# Patient Record
Sex: Female | Born: 1950 | Race: Black or African American | Hispanic: No | Marital: Married | Smoking: Never smoker
Health system: Southern US, Community
[De-identification: ages and names within clinical notes are randomized; demographics above are authoritative.]

## PROBLEM LIST (undated history)

## (undated) DIAGNOSIS — J8410 Pulmonary fibrosis, unspecified: Secondary | ICD-10-CM

## (undated) HISTORY — PX: OTHER SURGICAL HISTORY: SHX169

---

## 1985-02-17 HISTORY — PX: ABDOMINAL HYSTERECTOMY: SHX81

## 1999-06-18 ENCOUNTER — Inpatient Hospital Stay (HOSPITAL_COMMUNITY): Admission: AD | Admit: 1999-06-18 | Discharge: 1999-06-21 | Payer: Self-pay | Admitting: Neurological Surgery

## 1999-06-19 ENCOUNTER — Encounter: Payer: Self-pay | Admitting: Neurological Surgery

## 1999-12-10 ENCOUNTER — Encounter: Payer: Self-pay | Admitting: Neurological Surgery

## 1999-12-10 ENCOUNTER — Ambulatory Visit (HOSPITAL_COMMUNITY): Admission: RE | Admit: 1999-12-10 | Discharge: 1999-12-10 | Payer: Self-pay | Admitting: Neurological Surgery

## 2000-12-16 ENCOUNTER — Encounter: Payer: Self-pay | Admitting: Neurological Surgery

## 2000-12-16 ENCOUNTER — Ambulatory Visit (HOSPITAL_COMMUNITY): Admission: RE | Admit: 2000-12-16 | Discharge: 2000-12-16 | Payer: Self-pay | Admitting: Neurological Surgery

## 2001-02-17 HISTORY — PX: COLONOSCOPY: SHX174

## 2002-08-08 ENCOUNTER — Ambulatory Visit (HOSPITAL_COMMUNITY): Admission: RE | Admit: 2002-08-08 | Discharge: 2002-08-08 | Payer: Self-pay | Admitting: Neurological Surgery

## 2002-08-08 ENCOUNTER — Encounter: Payer: Self-pay | Admitting: Neurological Surgery

## 2004-08-30 ENCOUNTER — Ambulatory Visit (HOSPITAL_COMMUNITY): Admission: RE | Admit: 2004-08-30 | Discharge: 2004-08-30 | Payer: Self-pay | Admitting: Neurological Surgery

## 2004-09-23 ENCOUNTER — Ambulatory Visit: Payer: Self-pay | Admitting: Family Medicine

## 2004-10-04 ENCOUNTER — Ambulatory Visit: Payer: Self-pay | Admitting: Cardiology

## 2005-08-14 ENCOUNTER — Ambulatory Visit: Payer: Self-pay | Admitting: Unknown Physician Specialty

## 2005-10-07 ENCOUNTER — Ambulatory Visit (HOSPITAL_COMMUNITY): Admission: RE | Admit: 2005-10-07 | Discharge: 2005-10-07 | Payer: Self-pay | Admitting: Neurological Surgery

## 2006-01-13 ENCOUNTER — Ambulatory Visit (HOSPITAL_COMMUNITY): Admission: RE | Admit: 2006-01-13 | Discharge: 2006-01-13 | Payer: Self-pay | Admitting: Neurological Surgery

## 2006-08-18 ENCOUNTER — Ambulatory Visit: Payer: Self-pay | Admitting: Unknown Physician Specialty

## 2006-08-25 ENCOUNTER — Ambulatory Visit: Payer: Self-pay | Admitting: Unknown Physician Specialty

## 2006-09-26 ENCOUNTER — Encounter: Admission: RE | Admit: 2006-09-26 | Discharge: 2006-09-26 | Payer: Self-pay | Admitting: Neurological Surgery

## 2007-05-27 ENCOUNTER — Ambulatory Visit: Payer: Self-pay | Admitting: Unknown Physician Specialty

## 2007-09-01 ENCOUNTER — Ambulatory Visit: Payer: Self-pay | Admitting: Unknown Physician Specialty

## 2007-11-03 ENCOUNTER — Encounter: Admission: RE | Admit: 2007-11-03 | Discharge: 2007-11-03 | Payer: Self-pay | Admitting: Neurological Surgery

## 2008-04-19 ENCOUNTER — Encounter: Admission: RE | Admit: 2008-04-19 | Discharge: 2008-04-19 | Payer: Self-pay | Admitting: Neurological Surgery

## 2008-11-08 ENCOUNTER — Ambulatory Visit: Payer: Self-pay | Admitting: Unknown Physician Specialty

## 2009-05-02 ENCOUNTER — Encounter: Admission: RE | Admit: 2009-05-02 | Discharge: 2009-05-02 | Payer: Self-pay | Admitting: Neurological Surgery

## 2009-08-07 ENCOUNTER — Encounter (INDEPENDENT_AMBULATORY_CARE_PROVIDER_SITE_OTHER): Payer: Self-pay | Admitting: Neurological Surgery

## 2009-08-07 ENCOUNTER — Inpatient Hospital Stay (HOSPITAL_COMMUNITY): Admission: RE | Admit: 2009-08-07 | Discharge: 2009-08-10 | Payer: Self-pay | Admitting: Neurological Surgery

## 2009-10-11 ENCOUNTER — Encounter: Admission: RE | Admit: 2009-10-11 | Discharge: 2009-10-11 | Payer: Self-pay | Admitting: Neurological Surgery

## 2009-11-14 ENCOUNTER — Ambulatory Visit: Payer: Self-pay | Admitting: Unknown Physician Specialty

## 2010-05-05 LAB — BASIC METABOLIC PANEL
CO2: 22 mEq/L (ref 19–32)
Calcium: 8.7 mg/dL (ref 8.4–10.5)
Calcium: 8.9 mg/dL (ref 8.4–10.5)
Creatinine, Ser: 0.69 mg/dL (ref 0.4–1.2)
GFR calc Af Amer: >60 mL/min (ref >60)
GFR calc Af Amer: >60 mL/min (ref >60)
GFR calc non Af Amer: >60 mL/min (ref >60)
Glucose, Bld: 144 mg/dL — ABNORMAL HIGH (ref 70–99)
Potassium: 4 mEq/L (ref 3.5–5.1)
Sodium: 137 mEq/L (ref 135–145)
Sodium: 138 mEq/L (ref 135–145)

## 2010-05-05 LAB — CBC
HCT: 31.7 % — ABNORMAL LOW (ref 36.0–46.0)
Hemoglobin: 10.7 g/dL — ABNORMAL LOW (ref 12.0–15.0)
Hemoglobin: 11.2 g/dL — ABNORMAL LOW (ref 12.0–15.0)
MCHC: 33.4 g/dL (ref 30.0–36.0)
MCHC: 33.7 g/dL (ref 30.0–36.0)
RBC: 3.94 MIL/uL (ref 3.87–5.11)
RBC: 4.2 MIL/uL (ref 3.87–5.11)
RDW: 15.5 % (ref 11.5–15.5)
WBC: 11.8 10*3/uL — ABNORMAL HIGH (ref 4.0–10.5)

## 2010-05-05 LAB — TYPE AND SCREEN
ABO/RH(D): O POS
Antibody Screen: NEGATIVE

## 2010-05-05 LAB — ABO/RH: ABO/RH(D): O POS

## 2010-05-06 LAB — T4: T4, Total: 9.5 ug/dL (ref 5.0–12.5)

## 2010-05-06 LAB — CORTISOL: Cortisol, Plasma: 6.4 ug/dL

## 2010-05-06 LAB — TSH: TSH: 1.013 u[IU]/mL (ref 0.350–4.500)

## 2010-05-06 LAB — CBC
HCT: 36 % (ref 36.0–46.0)
Hemoglobin: 11.9 g/dL — ABNORMAL LOW (ref 12.0–15.0)
MCHC: 33.2 g/dL (ref 30.0–36.0)
MCV: 80.7 fL (ref 78.0–100.0)
Platelets: 200 K/uL (ref 150–400)
RBC: 4.46 MIL/uL (ref 3.87–5.11)
RDW: 16.2 % — ABNORMAL HIGH (ref 11.5–15.5)
WBC: 5.8 K/uL (ref 4.0–10.5)

## 2010-05-06 LAB — SURGICAL PCR SCREEN
MRSA, PCR: NEGATIVE
Staphylococcus aureus: NEGATIVE

## 2010-05-06 LAB — T3: T3, Total: 105.9 ng/dL (ref 80.0–204.0)

## 2010-07-05 NOTE — Discharge Summary (Signed)
[place]. [facility]  Patient:    Jocelyn Ward, Jocelyn Ward                        MRN: [id number] Adm. Date:  [id number] Disc. Date: [id number] Attending:  Jonne Ply CC:         Dr. Micah Noel, [place], [place]             Dr. Noel Christmas, [place], [place]             Alfonse Alpers. Dagoberto Ligas, M.D.             Stefani Dama, M.D.                           Discharge Summary  ADMITTING DIAGNOSES: 1. Transient ischemic attacks. 2. Pituitary tumor. 3. Rule out vasculitis.  DISCHARGE DIAGNOSES: 1. Pituitary microadenoma. 2. Prolactinoma. 3. Transient ischemic attack with right hemispheric symptoms.  CONDITION ON DISCHARGE:  Stable.  HISTORY OF PRESENT ILLNESS:  The patient is a 60 year old right-handed individual who was experiencing right hemispheric transient ischemic attacks characterized by numbness in the left arm and left leg.  Several of these episodes had occurred over the past two weeks time.  The patient was admitted to [facility] on June 14, 1999.  While on Plavix and aspirin she had an additional episode, which involved some numbness on the left side of the face.  The patient was started on heparin.  An MRI of the brain revealed the presence of an enlarged sellar mass.  Because of concerns of this lesion, she was transferred here for further evaluation and care.  HOSPITAL COURSE:  The patient was admitted on the evening of Jun 18, 1999. Further evaluation included an MRI of the sellar region, which demonstrated a pituitary lesion measuring 17 x 16 x 15 mm in size.  There was no compression of the optic chiasm.  There was, however, some compression of the pituitary stalk.  Laboratory studies were within limits of normal save for an elevated prolactin of 63.  The patient had also symptoms of amenorrhea and galactorrhea.  She was seen by Dr. Corrin Parker of the endocrine service who concurred that this may likely be a nonsecreting pituitary  adenoma; however, a trial of Parlodel to see if this would control her symptoms would be most appropriate at this time.  She was started on Parlodel 2.5 mg a day.  The patients heparin was stopped at the time of admission to [facility].  She has not had any further recurring symptoms of left facial numbness.  She has been advised to take a baby aspirin a day.  Angiographic study of her carotid vessels is completely within the limits of normal, with no plaque anywhere along her carotid tree, and no evidence of vasculitis in her anterior circulation.  The patient is discharged at this time on Parlodel 2.5 mg a day, in addition to one baby aspirin per day.  She may return to work this coming Monday, Jun 24, 1999.  FOLLOW-UP:  She will be seen in my office in four to six weeks time.  She will be seen in follow-up by Dr. Dagoberto Ligas in the next few weeks at his request. She will also be seen for further general health follow-up by Dr. Micah Noel in [place], her primary care physician. DD:  06/21/99 TD:  06/23/99 Job: [id number]

## 2010-07-05 NOTE — Consult Note (Signed)
[place]. [facility]  Patient:    Jocelyn Ward, Jocelyn Ward                        MRN: [id number] Adm. Date:  [id number] Attending:  Jonne Ply                          Consultation Report  HISTORY:  This is a 60 year old woman who presented with a history of left-sided numbness and a history of headache on the right side.  She was evaluated in the  emergency room and had a CT scan which was negative.  She then had an MRI scan which showed a pituitary enlargement, and was transferred to [facility] with a history of a pituitary enlargement.  Her symptoms of numbness have resolved.  A  repeat MRI scan was done, and it showed a macroadenoma of the pituitary.  It measures 17 x 16 x 15 mm.  She relates a history of having amenorrhea for a period of 17 months.  She has noted breast discharge from both nipples.  She has not been having any hot flashes.  PAST MEDICAL HISTORY:  She does have a history of scleroderma in the past but is not taking any medications for this.  Her past medical history is essentially benign with no other hospitalizations.  She has one childbirth.  PERSONAL HISTORY:  She does not smoke or drink excessive amounts of alcohol.  ALLERGIES:  No history of allergies.  FAMILY HISTORY:  Essentially negative.  REVIEW OF SYSTEMS:  She has had the amenorrhea in the past.  She has had moderate weight gain.  She has also had some breast engorgement and she has noted some arthralgia.  PHYSICAL EXAMINATION:  GENERAL:  This is a well-developed woman who is moderately obese.  SKIN:  Warm.  HEENT:  Head is normocephalic.  Eyes equally respond to light.  NECK:  Supple.  The thyroid is palpable and possibly at the upper limits of normal.  LUNGS:  Clear.  BREASTS:  Reveals easily-expressible fluid which is milky from both nipples.  CARDIOVASCULAR:  Rhythm is regular.  ABDOMEN:  Soft.  No masses are present.  EXTREMITIES:   Normal.  NEUROMUSCULAR:  Essentially normal.  IMPRESSION:  Pituitary macroadenoma with other laboratory studies negative except for a prolactin level of 68.  DISCUSSION:  The patient has a macroadenoma.  It is possible that this is causing soft compression as an etiology of the galactorrhea and amenorrhea.  However, it would seem reasonable to try a course of Parlodel and see if this resolves both the symptoms and also the pituitary nodule.  Since she has no pressing need for surgery at this time, it seems like a reasonable approach.  Thank you for the opportunity in seeing this patient. DD:  06/20/99 TD:  06/21/99 Job: [id number]

## 2010-07-05 NOTE — H&P (Signed)
[place]. [facility]  Patient:    Jocelyn Ward, Jocelyn Ward                        MRN: [id number] Adm. Date:  [id number] Attending:  Jonne Ply Dictator:   Stefani Dama, M.D.                         History and Physical  ADMISSION DIAGNOSES: 1. Left sided numbness secondary to right hemispheric transient ischemic    attack. 2. Pituitary tumor, rule out prolactinoma. 3. Rule out vasculitis.  HISTORY OF PRESENT ILLNESS:  The patient is a 60 year old right-handed female who has had a history of multiple episodes of left sided hand and foot numbness with one episode of left facial numbness in addition to the hand and foot numbness that occurred on Sunday while in the hospital.  She was placed in the hospital by Dr. Micah Noel, her primary care physician, on 4/27 after the patient had a couple of episodes of transient ischemic attack characterized by left facial and left leg numbness.  An MRI was ultimately performed which showed the presence of an enlarged pituitary gland with some question of it causing compression of the carotid artery on the right.  It was felt that she needed further neurosurgical evaluation.  I discussed the situation with Dr. Micah Noel today.  Laboratory studies done at [facility] included CBC and a metabolic panel which was within limits of normal, including a low normal triglycerides, normal cholesterol, normal LDL and HDL levels.  The patient also had thyroid studies which included a normal P3 by RIA of 160, T4 of 8.3, TSH of 1.5.  The patient denies any significant episodes of headaches. She denies any weakness.  She did notice some episodes of blurred vision while at work.  She felt this was due primarily to eye strain.  She denies any episodes of nausea, ringing of ears, tingling, or numbness.  PAST MEDICAL HISTORY:  Episode of scleroderma in the past.  She does not see her primary medical doctor regularly, and takes no  medication on a regular basis.  PAST SURGICAL HISTORY:  She denies any previous surgery.  ALLERGIES:  No known drug allergies.  FAMILY HISTORY:  Negative for any significant history of diabetes, hypertension.  There is no glandular problems that the patient can relate to in the family.  SOCIAL HISTORY:  The patient has a son that is 14 years old.  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  She does not smoke, she does not drink alcohol.  REVIEW OF SYSTEMS:  Negative in all systems except for the presence of breast engorgement which the patient notes.  PHYSICAL EXAMINATION:  GENERAL:  She is an alert, oriented, and cooperative individual in no overt distress.  VITAL SIGNS:  Blood pressure is 120/70, heart rate is 72 and regular, respirations 14.  HEENT:  Normocephalic, atraumatic.  Pupils are 4 mm, brisk, reactive to light and accommodation.  Extraocular movements are full.  Face is symmetric to grimmace.  Tongue and uvula are in the midline.  Sclera and conjunctivae are clear.  NECK:  No masses, and no bruits are heard.  LUNGS:  Clear to auscultation.  HEART:  Regular rate and rhythm.  ABDOMEN:  Soft, bowel sounds positive, no masses are palpable.  EXTREMITIES:  No clubbing, cyanosis, or edema.  Pulses in the peripheri are 2+ and  equal.  The patients station and gait are normal.  Deep tendon reflexes are 2+ and symmetric in the biceps and triceps, 2+ in the patellae and Achilles, Babinskis are negative.  BREASTS:  Deferred at this time.  IMPRESSION:  The patient has evidence of an abnormally enlarged pituitary gland by MRI scan.  She also has had transient ischemic attacks involving the right hemisphere with left-sided numbness.  Further evaluation will include stopping the patients heparin so she may undergo cerebral angiography of both carotid arteries, an MRI of the brain with attention to the pituitary fossa will also be performed.   Baseline blood tests including prolactin level, FSH, LH, and growth hormone levels will be obtained.  Additionally, the patient will have a sedimentation rate, rheumatoid factor, and an ANA drawn to rule out the possibility of an autoimmune disorder related to her scleroderma which may be causing a vasculitis. DD:  06/18/99 TD:  06/19/99 Job: [id number]

## 2010-10-18 ENCOUNTER — Other Ambulatory Visit: Payer: Self-pay | Admitting: Neurological Surgery

## 2010-10-18 DIAGNOSIS — D497 Neoplasm of unspecified behavior of endocrine glands and other parts of nervous system: Secondary | ICD-10-CM

## 2010-10-24 ENCOUNTER — Other Ambulatory Visit: Payer: Self-pay

## 2010-10-26 ENCOUNTER — Ambulatory Visit
Admission: RE | Admit: 2010-10-26 | Discharge: 2010-10-26 | Disposition: A | Discharge status: Home / Self Care | Payer: 59 | Source: Ambulatory Visit | Attending: Neurological Surgery | Admitting: Neurological Surgery

## 2010-10-26 DIAGNOSIS — D497 Neoplasm of unspecified behavior of endocrine glands and other parts of nervous system: Secondary | ICD-10-CM

## 2010-10-26 MED ORDER — GADOBENATE DIMEGLUMINE 529 MG/ML IV SOLN
10.0000 mL | Freq: Once | INTRAVENOUS | Status: AC | PRN
  Administered 2010-10-26: 10 mL via INTRAVENOUS

## 2010-11-22 ENCOUNTER — Ambulatory Visit: Payer: Self-pay | Admitting: Unknown Physician Specialty

## 2011-03-24 ENCOUNTER — Observation Stay: Payer: Self-pay | Admitting: Internal Medicine

## 2011-03-24 LAB — CBC
HCT: 36.1 % (ref 35.0–47.0)
HGB: 12.3 g/dL (ref 12.0–16.0)
MCHC: 34 g/dL (ref 32.0–36.0)
MCV: 83 fL (ref 80–100)
Platelet: 182 10*3/uL (ref 150–440)
WBC: 5.1 10*3/uL (ref 3.6–11.0)

## 2011-03-24 LAB — CK TOTAL AND CKMB (NOT AT ARMC)
CK, Total: 88 U/L (ref 21–215)
CK, Total: 78 U/L (ref 21–215)
CK-MB: 0.9 ng/mL (ref 0.5–3.6)
CK-MB: 0.9 ng/mL (ref 0.5–3.6)

## 2011-03-24 LAB — COMPREHENSIVE METABOLIC PANEL
Albumin: 3.4 g/dL (ref 3.4–5.0)
Anion Gap: 10 (ref 7–16)
Bilirubin,Total: 0.3 mg/dL (ref 0.2–1.0)
Calcium, Total: 8.8 mg/dL (ref 8.5–10.1)
Chloride: 108 mmol/L — ABNORMAL HIGH (ref 98–107)
Co2: 24 mmol/L (ref 21–32)
EGFR (African American): >60
Osmolality: 284 (ref 275–301)
Potassium: 4.2 mmol/L (ref 3.5–5.1)
Sodium: 142 mmol/L (ref 136–145)
Total Protein: 7.3 g/dL (ref 6.4–8.2)

## 2011-03-24 LAB — TROPONIN I
Troponin-I: <0.02 ng/mL
Troponin-I: <0.02 ng/mL

## 2011-03-25 LAB — CK TOTAL AND CKMB (NOT AT ARMC): CK-MB: 0.9 ng/mL (ref 0.5–3.6)

## 2011-11-28 ENCOUNTER — Ambulatory Visit: Payer: Self-pay | Admitting: Unknown Physician Specialty

## 2013-03-30 ENCOUNTER — Ambulatory Visit: Payer: Self-pay | Admitting: Unknown Physician Specialty

## 2014-06-11 NOTE — H&P (Signed)
PATIENT NAME:  Jocelyn Ward, Jocelyn Ward MR#:  [id number] DATE OF BIRTH:  1950/12/26  DATE OF ADMISSION:  03/24/2011  PRIMARY CARE PHYSICIAN: Maryland Pink, MD   CHIEF COMPLAINT: Chest pain since Friday.   HISTORY OF PRESENT ILLNESS: Ms. Buttram is a pleasant 64 year old female with past medical history of mixed connective tissue disorder. She has Raynaud's with biopsy proven history of scleroderma, was on methotrexate, has been off methotrexate and Norvasc for some time since she's been clinically doing well. She comes to the Emergency Room with complaints of chest pain on and off for the past week, more so since Friday. The patient became very diaphoretic in the middle of the night with mid substernal chest pain, "feels like a knot" and radiating to the left arm. She came to the Emergency Room and received two sublingual nitro. Pain is now 3/5. Her EKG shows flipped T waves in anterior and inferior leads. There is no EKG to compare. The patient is being admitted for further evaluation and management. The patient also reports a lot of burping, bloating, and increased flatus for the past few weeks. She does not take anything over-the-counter for the symptoms. Denies any abdominal pain after eating food. The patient is afebrile and is being admitted for further evaluation of her chest pain.   PAST MEDICAL HISTORY:  1. Asthma.  2. Pituitary adenoma, status post resection in 2011.  3. Seasonal allergic rhinitis.  4. Reflux esophagitis.  5. Mixed connective tissue disease. She has Raynaud's with synovitis with MCP narrowing and history of scleroderma. The patient was on methotrexate for her scleroderma and Norvasc for her Raynaud's phenomena which she has not been taking.   CURRENT MEDICATIONS: None.   PAST SURGICAL HISTORY: Hysterectomy.   ALLERGIES: No known drug allergies.   FAMILY HISTORY: Positive diabetes in father.   SOCIAL HISTORY: Nonsmoker. Retired from [employer]: CONSTITUTIONAL:  Positive for fatigue, weakness. EYES: No blurred or double vision. ENT: No tinnitus, ear pain, or hearing loss. RESPIRATORY: No cough, wheeze, hemoptysis. CARDIOVASCULAR: Positive for chest pain. No hypertension. GI: No nausea. Positive for bloating and gastroesophageal reflux disease. GU: No dysuria or hematuria. ENDOCRINE: No polyuria or nocturia. HEMATOLOGY: No anemia or easy bruising. SKIN: No acne or rash. MUSCULOSKELETAL: Positive for arthritis. NEUROLOGIC: No cerebrovascular accident or transient ischemic attack. PSYCH: No anxiety or depression. All other systems reviewed and negative.   PHYSICAL EXAMINATION:   GENERAL: The patient is awake, alert, and oriented x3 not in acute distress.   VITAL SIGNS: Afebrile, pulse 60, blood pressure 134/84, pulse oximetry 99% on room air.   HEENT: Atraumatic, normocephalic. Pupils equal, round, and reactive to light and accommodation. Extraocular movements intact. Oral mucosa is moist.   NECK: Supple. No JVD. No carotid bruit.   RESPIRATORY: Clear to auscultation bilaterally. No rales, rhonchi, respiratory distress, or labored breathing.   CARDIOVASCULAR: Both the heart sounds are normal. Rate, rhythm is regular. PMI not lateralized. Chest nontender. No organomegaly.   ABDOMEN: Soft, benign, nontender. No organomegaly. Positive bowel sounds.   NEUROLOGIC: Grossly intact cranial nerves II through XII. No motor or sensory deficits.   PSYCH: The patient is awake, alert, and oriented x3.   LABORATORY, DIAGNOSTIC, AND RADIOLOGICAL DATA: EKG shows flipped T waves in anterior/inferior leads. No old EKG for comparison. Cardiac enzymes negative. Comprehensive metabolic panel as well as CBC is within normal limits. Chest x-ray no acute changes identified.   ASSESSMENT: 64 year old Ms. Sackrider with:  1. Chest pain with  abnormal EKG, flipped T waves in inferior and anterior leads. No old EKG to compare with. This very well could be coronary vasospasm due to her  history of Raynaud's. She is not on any medication. She used to be on Norvasc for it. The patient had normal cath in 2006.  2. History of scleroderma with Raynaud's, off methotrexate, not on Norvasc at home since she was doing well.  3. Asthma.  4. History of pituitary adenoma status post removal in 2011.   PLAN:  1. Admit patient for observation.  2. FULL CODE.  3. I will start the patient on some low dose amlodipine.  4. Continue aspirin. 5. Lovenox b.i.d.  6. Will also continue some nitroglycerin for p.r.n. chest pain. Will give her p.r.n. since her blood pressure is a bit on the lower side. If her symptoms continue, will put half an inch to 1 inch of paste.  7. Dr. Nehemiah Massed to see the patient in consultation. Case was discussed with him.  8. Will start some Protonix for bloating and excessive gas symptoms.  9. Echo Doppler of the heart.   Hospital admission plan was discussed with the patient and her family members.   CODE STATUS: The patient will be a FULL CODE.  TIME SPENT: 45 minutes.   ____________________________ Hart Rochester Posey Pronto, MD sap:drc D: 03/24/2011 11:04:05 ET T: 03/24/2011 11:23:55 ET JOB#: [id number]  cc: Rosette Bellavance A. Posey Pronto, MD, <Dictator> Irven Easterly. Kary Kos, MD Ilda Basset MD ELECTRONICALLY SIGNED 03/27/2011 7:40

## 2014-06-11 NOTE — Consult Note (Signed)
Present Illness middle-age female with known previous borderline hypertension and hyperlipidemia who has had a cardiac catheterization in 2006 sowing normal coronary arteries.  She also has had an echocardiogram at that time showing normal LV systolic function.  The patient has had no current significant risk factors, requiring further intervention and medication management.  He has had recent episodes of chest discomfort which have awakened her from sleep off and on with radiation into her shoulder and arm as well as up into her left neck.  This is associated with shortness of breath as well.  There was some mild diaphoresis.  The patient now has felt much better since arriving to the emergency room.  EKG shows normal sinus rhythm with anterior precordial T wave inversion.  Troponin and CK-MB are within normal limits  Family history No family members with early onset of cardiovascular disease  Social history Patient currently denies alcohol or tobacco use   Physical Exam:   GEN WD    HEENT pink conjunctivae    NECK No masses    RESP no use of accessory muscles  crackles    CARD Regular rate and rhythm    ABD denies tenderness  soft    LYMPH negative neck    EXTR negative cyanosis/clubbing    SKIN normal to palpation    NEURO cranial nerves intact    PSYCH alert   Review of Systems:   Subjective/Chief Complaint I had chest pain    Respiratory: Short of breath    Cardiovascular: Chest pain or discomfort    Review of Systems: All other systems were reviewed and found to be negative    Medications/Allergies Reviewed Medications/Allergies reviewed     Scleraderma:    Denies medical history:   Cardiac:  04-Feb-13 09:01    CK, Total 88   CPK-MB, Serum 0.9  Routine Chem:  04-Feb-13 09:01    Glucose, Serum 91   BUN 16   Creatinine (comp) 0.82   Sodium, Serum 142   Potassium, Serum 4.2   Chloride, Serum 108   CO2, Serum 24   Calcium (Total), Serum 8.8   Hepatic:  04-Feb-13 09:01    Bilirubin, Total 0.3   Alkaline Phosphatase 76   SGPT (ALT) 17   SGOT (AST) 21   Total Protein, Serum 7.3   Albumin, Serum 3.4  Routine Chem:  04-Feb-13 09:01    Osmolality (calc) 284   eGFR (African American) >60   eGFR (Non-African American) >60   Anion Gap 10  Routine Hem:  04-Feb-13 09:01    WBC (CBC) 5.1   RBC (CBC) 4.38   Hemoglobin (CBC) 12.3   Hematocrit (CBC) 36.1   Platelet Count (CBC) 182   MCV 83   MCH 28.1   MCHC 34.0   RDW 13.6  Cardiac:  04-Feb-13 09:01    Troponin I < 0.02   EKG:   EKG Interp. by me    Interpretation normal sinus rhythm with anterior precordial T wave inversion    NKA: None  Vital Signs/Nurse's Notes: **Vital Signs.:   04-Feb-13 12:42   Vital Signs Type Admission   Temperature Temperature (F) 97.6   Celsius 36.4   Temperature Source oral   Pulse Pulse 60   Respirations Respirations 18   Systolic BP Systolic BP 147   Diastolic BP (mmHg) Diastolic BP (mmHg) 87   Mean BP 101   Pulse Ox % Pulse Ox % 98   Pulse Ox Activity Level  At rest  Oxygen Delivery Room Air/ 21 %     Impression female with borderline risk factors for cardiovascular disease with chest pain and new onset abnormal EKG with T wave inversion and no current evidence of myocardial infarction    Plan 1.  Continue serial ECG and enzymes to assess for possible myocardial infarction and changes there as necessary. 2.  Treadmill EKG for exercise tolerance evidence of chest discomfort or myocardial ischemia. 3.  Echocardiogram for LV systolic dysfunction, valvular heart disease causing abnormal EKG 4.  Further surveillance of hypertension, hyperlipidemia, and additional medications as necessary. 5.  Further ambulation and follow for further symptoms requiring treatment options   Electronic Signatures: Corey Skains (MD)  (Signed 04-Feb-13 14:21)  Authored: General Aspect/Present Illness, History and Physical Exam, Review of  System, Past Medical History, Labs, EKG , Allergies, Vital Signs/Nurse's Notes, Impression/Plan   Last Updated: 04-Feb-13 14:21 by Corey Skains (MD)

## 2014-06-11 NOTE — Discharge Summary (Signed)
PATIENT NAME:  Jocelyn Ward, Jocelyn Ward MR#:  [id number] DATE OF BIRTH:  1950/08/12  DATE OF ADMISSION:  03/24/2011 DATE OF DISCHARGE:  03/25/2011   ADMITTING PHYSICIAN: Fritzi Mandes, MD   DISCHARGING PHYSICIAN: Gladstone Lighter, MD    PRIMARY CARE PHYSICIAN: Maryland Pink, MD   [place]: Cardiology consultation by Dr. Serafina Royals    DISCHARGE DIAGNOSES:  1. Chest pain secondary to costochondritis likely.  2. Scleroderma.  3. Raynaud's phenomenon.  4. Arthritis.  5. Asthma.  6. History of pituitary adenoma status post resection in 2011.    DISCHARGE MEDICATIONS: Aspirin 81 mg p.o. daily.   DISCHARGE DIET: Low sodium diet.   DISCHARGE ACTIVITY: As tolerated.    FOLLOW-UP INSTRUCTIONS:  1. PCP follow-up in 2 to 3 weeks.  2. Cardiology follow-up in six weeks or earlier if needed.   LABS AT THE TIME OF DISCHARGE: WBC 5.1, hemoglobin 12.3, hematocrit 36.1, platelet count 182, sodium 142, potassium 4.2, chloride 108, bicarb 24, BUN 16, creatinine 0.82, glucose 91, calcium 8.8, ALT 17, AST 21, alkaline phosphatase 76, total bilirubin 0.3, albumin 3.4. Troponin three sets have remained less than 0.02. Chest x-ray showing clear lung fields. No acute changes identified. Echo Doppler showing normal LV systolic function, EF 24%, trace mitral regurgitation, trace tricuspid regurgitation present. Cardiac exercise stress test showing negative study with normal ejection fraction   BRIEF HOSPITAL COURSE: Jocelyn Ward is a 64 year old African American female with past medical history significant for scleroderma with Raynaud's phenomenon and arthritis who came to the hospital complaining of chest pain.  1. Chest pain, probably costochondritis with recent cold and frequent coughing. She is not tender to touch currently and denies any chest pain. She was seen by cardiologist, Dr. Nehemiah Massed, and had a stress test which was negative. Her last cardiac catheterization for similar chest pain in 2006  showed normal coronary arteries. EKG showed anterior precordial T wave inversions and troponins have remained negative for three sets. She is chest pain free and since her stress test is negative she is being discharged home. Per Dr. Alveria Apley recommendations, she is being discharged on baby aspirin 81 mg to be taken every day. She has good exercise tolerance and will continue to exercise.  2. Scleroderma with Raynaud's phenomenon. She was on methotrexate at one point. That has been stopped as she has been doing well. She was also on Norvasc but she uses it only during the wintertime when she gets pain and decreased perfusion to her fingers.   No changes have been made to other medication. Her course has been otherwise uneventful in the hospital.   DISCHARGE CONDITION: Stable.   DISCHARGE DISPOSITION: Home.   TIME SPENT ON DISCHARGE: 40 minutes.   ____________________________ Gladstone Lighter, MD rk:drc D: 03/25/2011 13:30:50 ET T: 03/25/2011 14:09:12 ET JOB#: [id number]  cc: Gladstone Lighter, MD, <Dictator> Irven Easterly. Kary Kos, MD Corey Skains, MD Gladstone Lighter MD ELECTRONICALLY SIGNED 03/26/2011 10:23

## 2014-08-30 ENCOUNTER — Encounter: Payer: Self-pay | Admitting: General Surgery

## 2014-08-30 ENCOUNTER — Ambulatory Visit (INDEPENDENT_AMBULATORY_CARE_PROVIDER_SITE_OTHER): Payer: No Typology Code available for payment source | Admitting: General Surgery

## 2014-08-30 VITALS — BP 116/78 | HR 68 | Resp 13 | Ht 65.0 in | Wt 207.0 lb

## 2014-08-30 DIAGNOSIS — Z1211 Encounter for screening for malignant neoplasm of colon: Secondary | ICD-10-CM | POA: Diagnosis not present

## 2014-08-30 MED ORDER — POLYETHYLENE GLYCOL 3350 17 GM/SCOOP PO POWD: ORAL | Status: DC

## 2014-08-30 NOTE — Patient Instructions (Addendum)
Colonoscopy A colonoscopy is an exam to look at the entire large intestine (colon). This exam can help find problems such as tumors, polyps, inflammation, and areas of bleeding. The exam takes about 1 hour.  LET [facility] CARE PROVIDER KNOW ABOUT:   Any allergies you have.  All medicines you are taking, including vitamins, herbs, eye drops, creams, and over-the-counter medicines.  Previous problems you or members of your family have had with the use of anesthetics.  Any blood disorders you have.  Previous surgeries you have had.  Medical conditions you have. RISKS AND COMPLICATIONS  Generally, this is a safe procedure. However, as with any procedure, complications can occur. Possible complications include:  Bleeding.  Tearing or rupture of the colon wall.  Reaction to medicines given during the exam.  Infection (rare). BEFORE THE PROCEDURE   Ask your health care provider about changing or stopping your regular medicines.  You may be prescribed an oral bowel prep. This involves drinking a large amount of medicated liquid, starting the day before your procedure. The liquid will cause you to have multiple loose stools until your stool is almost clear or light green. This cleans out your colon in preparation for the procedure.  Do not eat or drink anything else once you have started the bowel prep, unless your health care provider tells you it is safe to do so.  Arrange for someone to drive you home after the procedure. PROCEDURE   You will be given medicine to help you relax (sedative).  You will lie on your side with your knees bent.  A long, flexible tube with a light and camera on the end (colonoscope) will be inserted through the rectum and into the colon. The camera sends video back to a computer screen as it moves through the colon. The colonoscope also releases carbon dioxide gas to inflate the colon. This helps your health care provider see the area better.  During  the exam, your health care provider may take a small tissue sample (biopsy) to be examined under a microscope if any abnormalities are found.  The exam is finished when the entire colon has been viewed. AFTER THE PROCEDURE   Do not drive for 24 hours after the exam.  You may have a small amount of blood in your stool.  You may pass moderate amounts of gas and have mild abdominal cramping or bloating. This is caused by the gas used to inflate your colon during the exam.  Ask when your test results will be ready and how you will get your results. Make sure you get your test results. Document Released: 02/01/2000 Document Revised: 11/24/2012 Document Reviewed: 10/11/2012 [facility] Patient Information 2015 [place], [place]. This information is not intended to replace advice given to you by your health care provider. Make sure you discuss any questions you have with your health care provider.  Patient has been scheduled for a colonoscopy on 10-10-14 at [facility].

## 2014-08-30 NOTE — H&P (Signed)
Patient ID: Jocelyn Ward, female DOB: [birth year], 64 y.o. MRN: [id number]  Chief Complaint   Patient presents with   .  Other     colonoscopy    HPI  Jocelyn Ward is a 64 y.o. female here for evaluation for a colonoscopy. Her last one was done in 2003. She denies any GI problems.  HPI  No past medical history on file.  Past Surgical History   Procedure  Laterality  Date   .  Colonoscopy   2003     Dr. Sonny Masters   .  Abdominal hysterectomy   1987    History reviewed. No pertinent family history.  Social History  History   Substance Use Topics   .  Smoking status:  Never Smoker   .  Smokeless tobacco:  Never Used   .  Alcohol Use:  No    No Known Allergies  Current Outpatient Prescriptions   Medication  Sig  Dispense  Refill   .  fluticasone (FLONASE) 50 MCG/ACT nasal spray  PLACE 2 SPRAYS INTO BOTH NOSTRILS ONCE DAILY.   0   .  polyethylene glycol powder (GLYCOLAX/MIRALAX) powder  255 grams one bottle for colonoscopy prep  255 g  0    No current facility-administered medications for this visit.    Review of Systems  Review of Systems  Constitutional: Negative.  Respiratory: Negative.  Cardiovascular: Negative.  Gastrointestinal: Negative.   Blood pressure 116/78, pulse 68, resp. rate 13, height 5\' 5"  (1.651 m), weight 207 lb (93.895 kg).  Physical Exam  Physical Exam  Constitutional: She is oriented to person, place, and time. She appears well-developed and well-nourished.  Cardiovascular: Normal rate, regular rhythm and normal heart sounds.  Pulmonary/Chest: Effort normal and breath sounds normal.  Neurological: She is alert and oriented to person, place, and time.  Skin: Skin is warm and dry.   Data Reviewed  12/23/2001 and colonoscopy completed by Leanna Battles, M.D. was reviewed. Normal exam. Completed for report of blood in stools.  Assessment   And it for screening colonoscopy.   Plan   n   Colonoscopy with possible biopsy/polypectomy prn: Information  regarding the procedure, including its potential risks and complications (including but not limited to perforation of the bowel, which may require emergency surgery to repair, and bleeding) was verbally given to the patient. Educational information regarding lower instestinal endoscopy was given to the patient. Written instructions for how to complete the bowel prep using Miralax were provided. The importance of drinking ample fluids to avoid dehydration as a result of the prep emphasized.  Patient has been scheduled for a colonoscopy on 10-10-14 at [facility].  PCP: Dr Tilford Pillar, [place]  08/30/2014, 7:04 PM

## 2014-08-30 NOTE — Progress Notes (Signed)
Patient ID: Jocelyn Ward, female   DOB: [birth year], 64 y.o.   MRN: [id number]  Chief Complaint  Patient presents with  . Other    colonoscopy    HPI Jocelyn Ward is a 64 y.o. female here for evaluation for a colonoscopy. Her last one was done in 2003. She denies any GI problems.  HPI  No past medical history on file.  Past Surgical History  Procedure Laterality Date  . Colonoscopy  2003    Dr. Sonny Masters  . Abdominal hysterectomy  1987    History reviewed. No pertinent family history.  Social History History  Substance Use Topics  . Smoking status: Never Smoker   . Smokeless tobacco: Never Used  . Alcohol Use: No    No Known Allergies  Current Outpatient Prescriptions  Medication Sig Dispense Refill  . fluticasone (FLONASE) 50 MCG/ACT nasal spray PLACE 2 SPRAYS INTO BOTH NOSTRILS ONCE DAILY.  0  . polyethylene glycol powder (GLYCOLAX/MIRALAX) powder 255 grams one bottle for colonoscopy prep 255 g 0   No current facility-administered medications for this visit.    Review of Systems Review of Systems  Constitutional: Negative.   Respiratory: Negative.   Cardiovascular: Negative.   Gastrointestinal: Negative.     Blood pressure 116/78, pulse 68, resp. rate 13, height 5\' 5"  (1.651 m), weight 207 lb (93.895 kg).  Physical Exam Physical Exam  Constitutional: She is oriented to person, place, and time. She appears well-developed and well-nourished.  Cardiovascular: Normal rate, regular rhythm and normal heart sounds.   Pulmonary/Chest: Effort normal and breath sounds normal.  Neurological: She is alert and oriented to person, place, and time.  Skin: Skin is warm and dry.    Data Reviewed 12/23/2001 and colonoscopy completed by Leanna Battles, M.D. was reviewed. Normal exam. Completed for report of blood in stools.  Assessment    And it for screening colonoscopy.    Plan     n        Colonoscopy with possible biopsy/polypectomy prn: Information regarding the  procedure, including its potential risks and complications (including but not limited to perforation of the bowel, which may require emergency surgery to repair, and bleeding) was verbally given to the patient. Educational information regarding lower instestinal endoscopy was given to the patient. Written instructions for how to complete the bowel prep using Miralax were provided. The importance of drinking ample fluids to avoid dehydration as a result of the prep emphasized.  Patient has been scheduled for a colonoscopy on 10-10-14 at [facility].  PCP: Dr Tilford Pillar, [place] Gleason 08/30/2014, 7:04 PM

## 2014-10-09 ENCOUNTER — Telehealth: Payer: Self-pay | Admitting: *Deleted

## 2014-10-09 MED ORDER — SODIUM CHLORIDE 0.9 % IV SOLN
INTRAVENOUS | Status: DC
  Administered 2014-10-10: 1000 mL via INTRAVENOUS

## 2014-10-09 NOTE — Telephone Encounter (Signed)
Message left for patient to call the office.   We need to confirm no medication changes since last office visit. Also, make sure she has Miralax prescription.   Patient is scheduled for a colonoscopy tomorrow, 10-10-14.

## 2014-10-10 ENCOUNTER — Ambulatory Visit: Payer: No Typology Code available for payment source | Admitting: Anesthesiology

## 2014-10-10 ENCOUNTER — Encounter
Admission: RE | Disposition: A | Discharge status: Home / Self Care | Payer: Self-pay | Source: Ambulatory Visit | Attending: General Surgery

## 2014-10-10 ENCOUNTER — Ambulatory Visit
Admission: RE | Admit: 2014-10-10 | Discharge: 2014-10-10 | Disposition: A | Discharge status: Home / Self Care | Payer: No Typology Code available for payment source | Source: Ambulatory Visit | Attending: General Surgery | Admitting: General Surgery

## 2014-10-10 ENCOUNTER — Encounter: Payer: Self-pay | Admitting: *Deleted

## 2014-10-10 DIAGNOSIS — Z8639 Personal history of other endocrine, nutritional and metabolic disease: Secondary | ICD-10-CM | POA: Insufficient documentation

## 2014-10-10 DIAGNOSIS — Z79899 Other long term (current) drug therapy: Secondary | ICD-10-CM | POA: Diagnosis not present

## 2014-10-10 DIAGNOSIS — Z90710 Acquired absence of both cervix and uterus: Secondary | ICD-10-CM | POA: Diagnosis not present

## 2014-10-10 DIAGNOSIS — Z7951 Long term (current) use of inhaled steroids: Secondary | ICD-10-CM | POA: Insufficient documentation

## 2014-10-10 DIAGNOSIS — Z1211 Encounter for screening for malignant neoplasm of colon: Secondary | ICD-10-CM | POA: Diagnosis not present

## 2014-10-10 HISTORY — PX: COLONOSCOPY WITH PROPOFOL: SHX5780

## 2014-10-10 SURGERY — COLONOSCOPY WITH PROPOFOL
Anesthesia: General

## 2014-10-10 MED ORDER — PROPOFOL INFUSION 10 MG/ML OPTIME
INTRAVENOUS | Status: DC | PRN
  Administered 2014-10-10: 120 ug/kg/min via INTRAVENOUS

## 2014-10-10 MED ORDER — FENTANYL CITRATE (PF) 100 MCG/2ML IJ SOLN
INTRAMUSCULAR | Status: DC | PRN
  Administered 2014-10-10: 50 ug via INTRAVENOUS

## 2014-10-10 MED ORDER — MIDAZOLAM HCL 2 MG/2ML IJ SOLN
INTRAMUSCULAR | Status: DC | PRN
  Administered 2014-10-10: 1 mg via INTRAVENOUS

## 2014-10-10 NOTE — Transfer of Care (Signed)
Immediate Anesthesia Transfer of Care Note  Patient: Jocelyn Ward  Procedure(s) Performed: Procedure(s): COLONOSCOPY WITH PROPOFOL (N/A)  Patient Location: PACU  Anesthesia Type:General and Regional  Level of Consciousness: awake, alert , oriented and sedated  Airway & Oxygen Therapy: Patient Spontanous Breathing and Patient connected to nasal cannula oxygen  Post-op Assessment: Report given to RN and Post -op Vital signs reviewed and stable  Post vital signs: Reviewed and stable  Last Vitals:  Filed Vitals:   10/10/14 1346  BP: 133/67  Pulse: 70  Temp: 37.3 C  Resp: 16    Complications: No apparent anesthesia complications

## 2014-10-10 NOTE — Anesthesia Procedure Notes (Signed)
Performed by: COOK-MARTIN, Analissa Bayless Pre-anesthesia Checklist: Patient identified, Emergency Drugs available, Suction available, Patient being monitored and Timeout performed Patient Re-evaluated:Patient Re-evaluated prior to inductionOxygen Delivery Method: Nasal cannula Preoxygenation: Pre-oxygenation with 100% oxygen Intubation Type: IV induction Placement Confirmation: positive ETCO2 and CO2 detector       

## 2014-10-10 NOTE — Anesthesia Postprocedure Evaluation (Signed)
  Anesthesia Post-op Note  Patient: Jocelyn Ward  Procedure(s) Performed: Procedure(s): COLONOSCOPY WITH PROPOFOL (N/A)  Anesthesia type:General  Patient location: PACU  Post pain: Pain level controlled  Post assessment: Post-op Vital signs reviewed, Patient's Cardiovascular Status Stable, Respiratory Function Stable, Patent Airway and No signs of Nausea or vomiting  Post vital signs: Reviewed and stable  Last Vitals:  Filed Vitals:   10/10/14 1346  BP: 133/67  Pulse: 70  Temp: 37.3 C  Resp: 16    Level of consciousness: awake, alert  and patient cooperative  Complications: No apparent anesthesia complications

## 2014-10-10 NOTE — Anesthesia Preprocedure Evaluation (Signed)
Anesthesia Evaluation  Patient identified by MRN, date of birth, ID band Patient awake    Reviewed: Allergy & Precautions, NPO status , Patient's Chart, lab work & pertinent test results  Airway Mallampati: II       Dental no notable dental hx. (+) Teeth Intact   Pulmonary neg pulmonary ROS,    Pulmonary exam normal       Cardiovascular negative cardio ROS Normal cardiovascular exam    Neuro/Psych negative neurological ROS     GI/Hepatic negative GI ROS, Neg liver ROS,   Endo/Other  negative endocrine ROS  Renal/GU negative Renal ROS     Musculoskeletal negative musculoskeletal ROS (+)   Abdominal Normal abdominal exam  (+)   Peds negative pediatric ROS (+)  Hematology negative hematology ROS (+)   Anesthesia Other Findings   Reproductive/Obstetrics negative OB ROS                             Anesthesia Physical Anesthesia Plan  ASA: I  Anesthesia Plan: General   Post-op Pain Management:    Induction: Intravenous  Airway Management Planned: Nasal Cannula  Additional Equipment:   Intra-op Plan:   Post-operative Plan:   Informed Consent: I have reviewed the patients History and Physical, chart, labs and discussed the procedure including the risks, benefits and alternatives for the proposed anesthesia with the patient or authorized representative who has indicated his/her understanding and acceptance.     Plan Discussed with: CRNA  Anesthesia Plan Comments:         Anesthesia Quick Evaluation

## 2014-10-10 NOTE — H&P (Signed)
Jocelyn Ward is an 64 y.o. female.   Chief Complaint: Screening colon HPI: Candidate for screening colonoscopy.   History reviewed. No pertinent past medical history.  Past Surgical History  Procedure Laterality Date  . Colonoscopy  2003    Dr. Sonny Masters  . Abdominal hysterectomy  1987  . Pituitary gland tumor      History reviewed. No pertinent family history. Social History:  reports that she has never smoked. She has never used smokeless tobacco. She reports that she does not drink alcohol or use illicit drugs.  Allergies: No Known Allergies  Medications Prior to Admission  Medication Sig Dispense Refill  . fluticasone (FLONASE) 50 MCG/ACT nasal spray PLACE 2 SPRAYS INTO BOTH NOSTRILS ONCE DAILY.  0  . polyethylene glycol powder (GLYCOLAX/MIRALAX) powder 255 grams one bottle for colonoscopy prep 255 g 0    No results found for this or any previous visit (from the past 37 hour(s)). No results found.  ROS  Blood pressure 133/67, pulse 70, temperature 99.1 F (37.3 C), temperature source Tympanic, resp. rate 16, height 5\' 5"  (1.651 m), weight 202 lb (91.627 kg), SpO2 100 %. Physical Exam  Constitutional: She appears well-developed and well-nourished.  HENT:  Head: Normocephalic and atraumatic.  Eyes: Conjunctivae are normal.  Neck: Neck supple. No thyromegaly present.  Cardiovascular: Normal rate and regular rhythm.   Respiratory: Effort normal and breath sounds normal.     Assessment/Plan Candidate for colonoscopy.  Robert Bellow 10/10/2014, 2:32 PM

## 2014-10-11 ENCOUNTER — Encounter: Payer: Self-pay | Admitting: General Surgery

## 2014-10-12 NOTE — Op Note (Signed)
[facility]astroenter[facility] Patient Name: Jocelyn Ward Procedure Date: 10/10/2014 2:33 PM MRN: [id number] Account #: [id number] Date of Birth: [birth year] Admit Type: Outpatient Age: 64 Room: [facility] ENDO ROOM 1 Gender: Female Note Status: Finalized Procedure:         Colonoscopy Indications:       Screening for colorectal malignant neoplasm Providers:         Robert Bellow, MD Referring MD:      Irven Easterly. Kary Kos, MD (Referring MD) Medicines:         Monitored Anesthesia Care Complications:     No immediate complications. Procedure:         Pre-Anesthesia Assessment:                    - Prior to the procedure, a History and Physical was                     performed, and patient medications, allergies and                     sensitivities were reviewed. The patient's tolerance of                     previous anesthesia was reviewed.                    - The risks and benefits of the procedure and the sedation                     options and risks were discussed with the patient. All                     questions were answered and informed consent was obtained.                    After obtaining informed consent, the colonoscope was                     passed under direct vision. Throughout the procedure, the                     patient's blood pressure, pulse, and oxygen saturations                     were monitored continuously. The colonoscopy was performed                     without difficulty. The patient tolerated the procedure                     well. The quality of the bowel preparation was excellent.                     The Colonoscope was introduced through the anus and                     advanced to the the terminal ileum. Findings:      The retroflexed view of the distal rectum and anal verge was normal and       showed no anal or rectal abnormalities.      The entire examined colon appeared normal on direct and retroflexion        views. Impression:        - The distal rectum and anal verge are normal on  retroflexion view.                    - No specimens collected. Recommendation:    - Repeat colonoscopy in 10 years for screening purposes. Robert Bellow, MD 10/10/2014 3:02:46 PM This report has been signed electronically. Number of Addenda: 0 Note Initiated On: 10/10/2014 2:33 PM Scope Withdrawal Time: 0 hours 7 minutes 23 seconds  Total Procedure Duration: 0 hours 18 minutes 5 seconds       [facility]

## 2016-01-28 ENCOUNTER — Other Ambulatory Visit: Payer: Self-pay | Admitting: General Surgery

## 2016-01-28 DIAGNOSIS — Z1231 Encounter for screening mammogram for malignant neoplasm of breast: Secondary | ICD-10-CM

## 2016-03-04 ENCOUNTER — Ambulatory Visit: Payer: No Typology Code available for payment source

## 2016-03-24 ENCOUNTER — Ambulatory Visit
Admission: RE | Admit: 2016-03-24 | Discharge: 2016-03-24 | Disposition: A | Discharge status: Home / Self Care | Payer: BLUE CROSS/BLUE SHIELD | Source: Ambulatory Visit | Attending: General Surgery | Admitting: General Surgery

## 2016-03-24 DIAGNOSIS — Z1231 Encounter for screening mammogram for malignant neoplasm of breast: Secondary | ICD-10-CM | POA: Diagnosis present

## 2018-09-21 ENCOUNTER — Other Ambulatory Visit: Payer: Self-pay | Admitting: Family Medicine

## 2018-09-21 DIAGNOSIS — Z1231 Encounter for screening mammogram for malignant neoplasm of breast: Secondary | ICD-10-CM

## 2018-11-04 ENCOUNTER — Ambulatory Visit
Admission: RE | Admit: 2018-11-04 | Discharge: 2018-11-04 | Disposition: A | Discharge status: Home / Self Care | Payer: Medicare HMO | Source: Ambulatory Visit | Attending: Family Medicine | Admitting: Family Medicine

## 2018-11-04 DIAGNOSIS — Z1231 Encounter for screening mammogram for malignant neoplasm of breast: Secondary | ICD-10-CM | POA: Insufficient documentation

## 2019-01-28 ENCOUNTER — Other Ambulatory Visit: Payer: Self-pay

## 2019-01-28 DIAGNOSIS — Z20822 Contact with and (suspected) exposure to covid-19: Secondary | ICD-10-CM

## 2019-01-29 LAB — NOVEL CORONAVIRUS, NAA: SARS-CoV-2, NAA: NOT DETECTED

## 2019-03-02 ENCOUNTER — Ambulatory Visit: Payer: No Typology Code available for payment source | Attending: Internal Medicine

## 2019-03-02 DIAGNOSIS — Z20822 Contact with and (suspected) exposure to covid-19: Secondary | ICD-10-CM

## 2019-03-03 LAB — NOVEL CORONAVIRUS, NAA: SARS-CoV-2, NAA: NOT DETECTED

## 2019-03-28 ENCOUNTER — Ambulatory Visit: Payer: No Typology Code available for payment source | Attending: Internal Medicine

## 2019-03-28 ENCOUNTER — Other Ambulatory Visit: Payer: Self-pay

## 2019-03-28 DIAGNOSIS — Z23 Encounter for immunization: Secondary | ICD-10-CM | POA: Insufficient documentation

## 2019-03-28 NOTE — Progress Notes (Signed)
   Covid-19 Vaccination Clinic  Name:  KINSLEA EWERS    MRN: [id number] DOB: 1950-06-23  03/28/2019  Ms. Musch was observed post Covid-19 immunization for 15 minutes without incidence. She was provided with Vaccine Information Sheet and instruction to access the V-Safe system.   Ms. Helman was instructed to call 911 with any severe reactions post vaccine: Marland Kitchen Difficulty breathing  . Swelling of your face and throat  . A fast heartbeat  . A bad rash all over your body  . Dizziness and weakness    Immunizations Administered    Name Date Dose VIS Date Route   Moderna COVID-19 Vaccine 03/28/2019 11:39 AM 0.5 mL 01/18/2019 Intramuscular   Manufacturer: Moderna   Lot: [id number]   [id number]

## 2019-04-27 ENCOUNTER — Ambulatory Visit: Payer: No Typology Code available for payment source | Attending: Internal Medicine

## 2019-04-27 DIAGNOSIS — Z23 Encounter for immunization: Secondary | ICD-10-CM | POA: Insufficient documentation

## 2019-04-27 NOTE — Progress Notes (Signed)
   Covid-19 Vaccination Clinic  Name:  Jocelyn Ward    MRN: [id number] DOB: 20-Dec-1950  04/27/2019  [name] was observed post Covid-19 immunization for 15 minutes without incident. She was provided with Vaccine Information Sheet and instruction to access the V-Safe system.   [name] was instructed to call 911 with any severe reactions post vaccine: Marland Kitchen Difficulty breathing  . Swelling of face and throat  . A fast heartbeat  . A bad rash all over body  . Dizziness and weakness   Immunizations Administered    Name Date Dose VIS Date Route   Moderna COVID-19 Vaccine 04/27/2019 11:37 AM 0.5 mL 01/18/2019 Intramuscular   Manufacturer: Moderna   Lot: [id number]   [id number]

## 2019-09-28 ENCOUNTER — Other Ambulatory Visit: Payer: Self-pay | Admitting: Family Medicine

## 2019-09-28 DIAGNOSIS — Z1231 Encounter for screening mammogram for malignant neoplasm of breast: Secondary | ICD-10-CM

## 2019-11-08 ENCOUNTER — Ambulatory Visit
Admission: RE | Admit: 2019-11-08 | Discharge: 2019-11-08 | Disposition: A | Discharge status: Home / Self Care | Payer: Medicare HMO | Source: Ambulatory Visit | Attending: Family Medicine | Admitting: Family Medicine

## 2019-11-08 DIAGNOSIS — Z1231 Encounter for screening mammogram for malignant neoplasm of breast: Secondary | ICD-10-CM

## 2020-08-17 ENCOUNTER — Ambulatory Visit: Payer: No Typology Code available for payment source | Attending: Internal Medicine

## 2020-08-17 ENCOUNTER — Other Ambulatory Visit: Payer: Self-pay

## 2020-08-17 DIAGNOSIS — Z23 Encounter for immunization: Secondary | ICD-10-CM

## 2020-08-17 MED ORDER — MODERNA COVID-19 VACCINE 100 MCG/0.5ML IM SUSP
INTRAMUSCULAR | 0 refills | Status: AC
  Filled 2020-08-17: qty 0.25, 1d supply, fill #0

## 2020-08-17 NOTE — Progress Notes (Signed)
   Covid-19 Vaccination Clinic  Name:  ARTHA STAVROS    MRN: [id number] DOB: 01-11-1951  08/17/2020  Ms. Friesz was observed post Covid-19 immunization for 15 minutes without incident. She was provided with Vaccine Information Sheet and instruction to access the V-Safe system.   Ms. Daloia was instructed to call 911 with any severe reactions post vaccine: Difficulty breathing  Swelling of face and throat  A fast heartbeat  A bad rash all over body  Dizziness and weakness   Immunizations Administered     Name Date Dose VIS Date Route   Moderna Covid-19 Booster Vaccine 08/17/2020 10:05 AM 0.25 mL 12/07/2019 Intramuscular   Manufacturer: Levan Hurst   Lot: [id number]   [place]: [place], PharmD, MBA Clinical Acute Care Pharmacist

## 2020-11-16 ENCOUNTER — Other Ambulatory Visit: Payer: Self-pay | Admitting: Family Medicine

## 2020-11-16 DIAGNOSIS — Z1231 Encounter for screening mammogram for malignant neoplasm of breast: Secondary | ICD-10-CM

## 2020-12-04 ENCOUNTER — Other Ambulatory Visit: Payer: Self-pay

## 2020-12-04 ENCOUNTER — Ambulatory Visit
Admission: RE | Admit: 2020-12-04 | Discharge: 2020-12-04 | Disposition: A | Discharge status: Home / Self Care | Payer: Medicare HMO | Source: Ambulatory Visit | Attending: Family Medicine | Admitting: Family Medicine

## 2020-12-04 DIAGNOSIS — Z1231 Encounter for screening mammogram for malignant neoplasm of breast: Secondary | ICD-10-CM | POA: Insufficient documentation

## 2021-03-27 ENCOUNTER — Other Ambulatory Visit: Payer: Self-pay | Admitting: Specialist

## 2021-03-27 DIAGNOSIS — R053 Chronic cough: Secondary | ICD-10-CM

## 2021-03-27 DIAGNOSIS — M349 Systemic sclerosis, unspecified: Secondary | ICD-10-CM

## 2021-04-01 ENCOUNTER — Ambulatory Visit: Payer: Medicare HMO

## 2021-04-02 ENCOUNTER — Other Ambulatory Visit: Payer: Self-pay

## 2021-04-02 ENCOUNTER — Ambulatory Visit
Admission: RE | Admit: 2021-04-02 | Discharge: 2021-04-02 | Disposition: A | Discharge status: Home / Self Care | Payer: Medicare HMO | Source: Ambulatory Visit | Attending: Specialist | Admitting: Specialist

## 2021-04-02 DIAGNOSIS — M349 Systemic sclerosis, unspecified: Secondary | ICD-10-CM | POA: Insufficient documentation

## 2021-04-02 DIAGNOSIS — R053 Chronic cough: Secondary | ICD-10-CM | POA: Diagnosis present

## 2021-10-14 ENCOUNTER — Other Ambulatory Visit: Payer: Self-pay | Admitting: Family Medicine

## 2021-10-14 DIAGNOSIS — Z1231 Encounter for screening mammogram for malignant neoplasm of breast: Secondary | ICD-10-CM

## 2021-12-05 ENCOUNTER — Ambulatory Visit
Admission: RE | Admit: 2021-12-05 | Discharge: 2021-12-05 | Disposition: A | Discharge status: Home / Self Care | Payer: Medicare HMO | Source: Ambulatory Visit | Attending: Family Medicine | Admitting: Family Medicine

## 2021-12-05 DIAGNOSIS — Z1231 Encounter for screening mammogram for malignant neoplasm of breast: Secondary | ICD-10-CM | POA: Insufficient documentation

## 2021-12-20 ENCOUNTER — Other Ambulatory Visit: Payer: Self-pay | Admitting: Orthopedic Surgery

## 2021-12-20 DIAGNOSIS — M5012 Mid-cervical disc disorder, unspecified level: Secondary | ICD-10-CM

## 2021-12-20 DIAGNOSIS — M4802 Spinal stenosis, cervical region: Secondary | ICD-10-CM

## 2021-12-27 ENCOUNTER — Ambulatory Visit
Admission: RE | Admit: 2021-12-27 | Discharge: 2021-12-27 | Disposition: A | Discharge status: Home / Self Care | Payer: Medicare HMO | Source: Ambulatory Visit | Attending: Orthopedic Surgery | Admitting: Orthopedic Surgery

## 2021-12-27 DIAGNOSIS — M4802 Spinal stenosis, cervical region: Secondary | ICD-10-CM

## 2021-12-27 DIAGNOSIS — M5012 Mid-cervical disc disorder, unspecified level: Secondary | ICD-10-CM

## 2021-12-31 ENCOUNTER — Ambulatory Visit
Admission: RE | Admit: 2021-12-31 | Discharge: 2021-12-31 | Disposition: A | Discharge status: Home / Self Care | Payer: Medicare HMO | Source: Ambulatory Visit | Attending: Orthopedic Surgery | Admitting: Orthopedic Surgery

## 2021-12-31 DIAGNOSIS — M5012 Mid-cervical disc disorder, unspecified level: Secondary | ICD-10-CM | POA: Diagnosis present

## 2021-12-31 DIAGNOSIS — M4802 Spinal stenosis, cervical region: Secondary | ICD-10-CM | POA: Insufficient documentation

## 2022-02-28 ENCOUNTER — Other Ambulatory Visit: Payer: Self-pay

## 2022-02-28 DIAGNOSIS — M5010 Cervical disc disorder with radiculopathy, unspecified cervical region: Secondary | ICD-10-CM

## 2022-03-01 ENCOUNTER — Other Ambulatory Visit: Payer: Self-pay | Admitting: Physical Medicine & Rehabilitation

## 2022-03-01 DIAGNOSIS — M5010 Cervical disc disorder with radiculopathy, unspecified cervical region: Secondary | ICD-10-CM

## 2022-11-04 IMAGING — CT CT CHEST W/O CM
1 series · 15 of 34 positions shown, 19 images · non-contrast
Comparison: None.

CLINICAL DATA: Cough for 4 months, scleroderma



[Series 2: thorax · axial · 0.64mm/px · z∈[-580,-336]mm · 15 of 144 slices shown, 19 images]
[im 11/144  mediastinal]
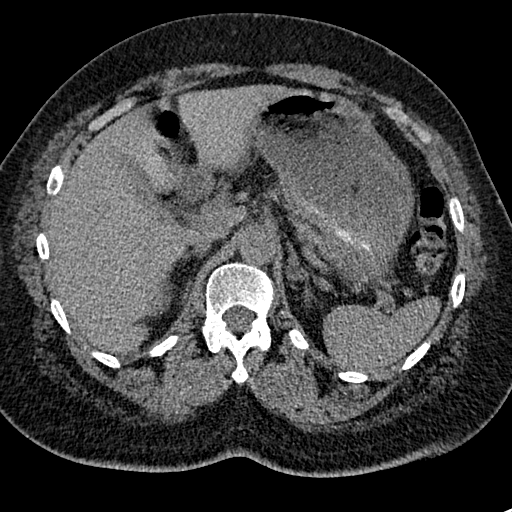
[im 11/144  lung]
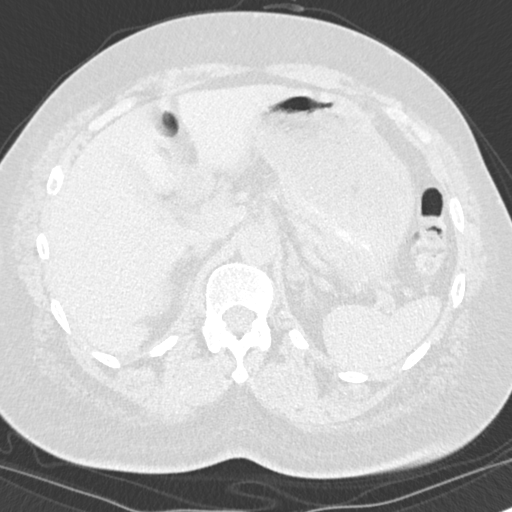
[im 22/144  lung]
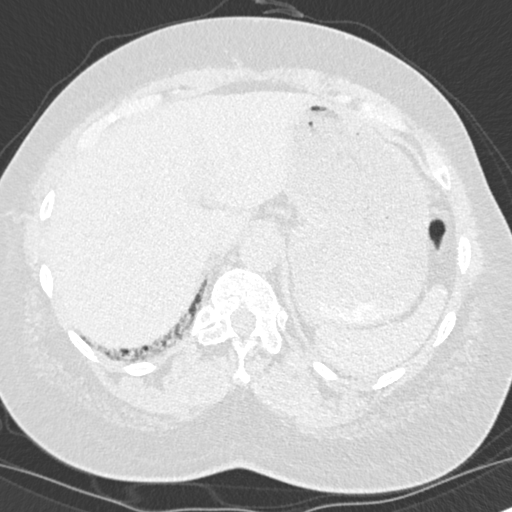
[im 29/144  lung]
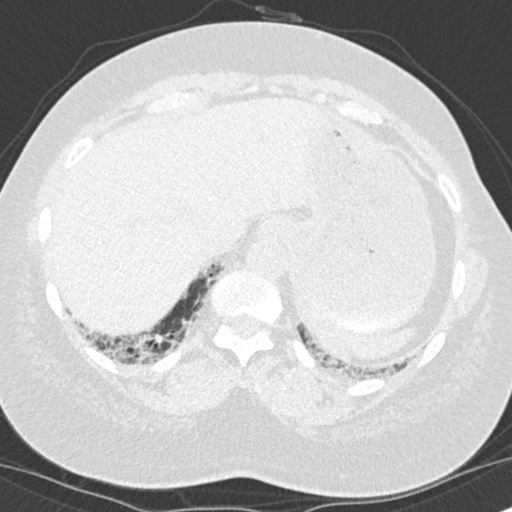
[im 38/144  lung]
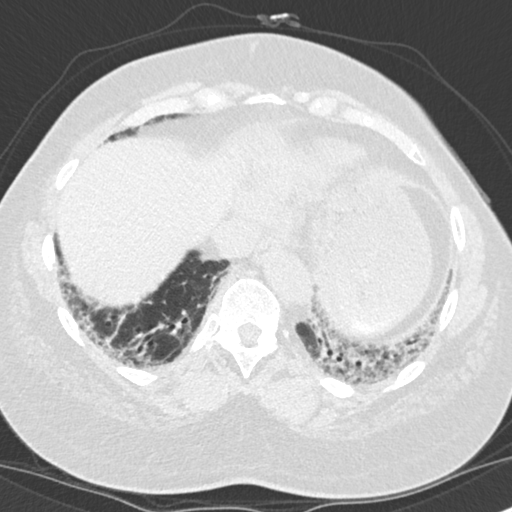
[im 48/144  mediastinal]
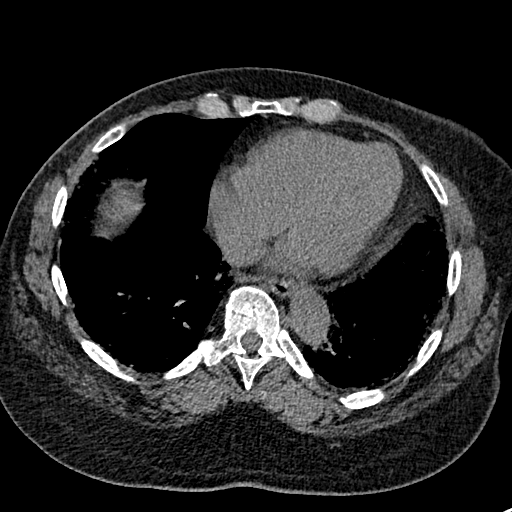
[im 48/144  lung]
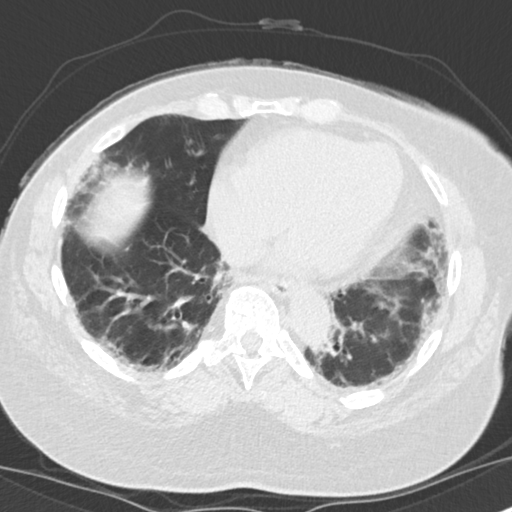
[im 58/144  lung]
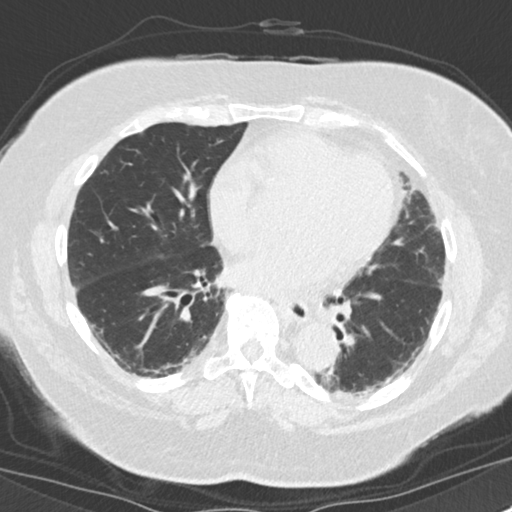
[im 64/144  lung]
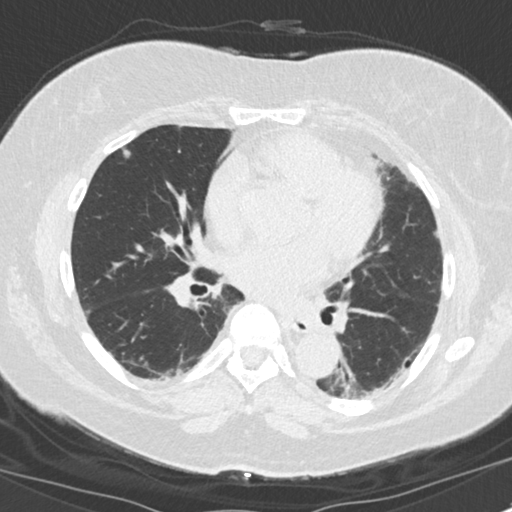
[im 75/144  lung]
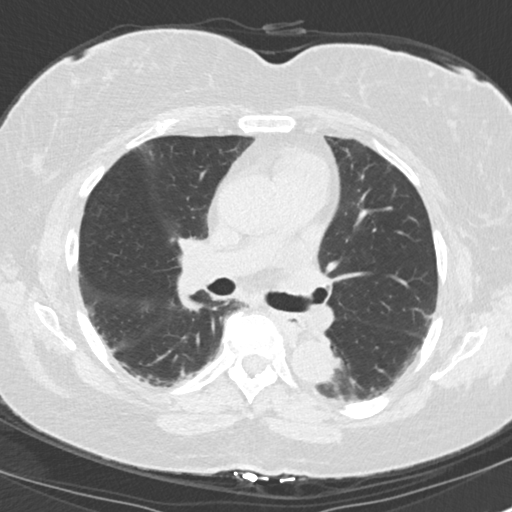
[im 80/144  mediastinal]
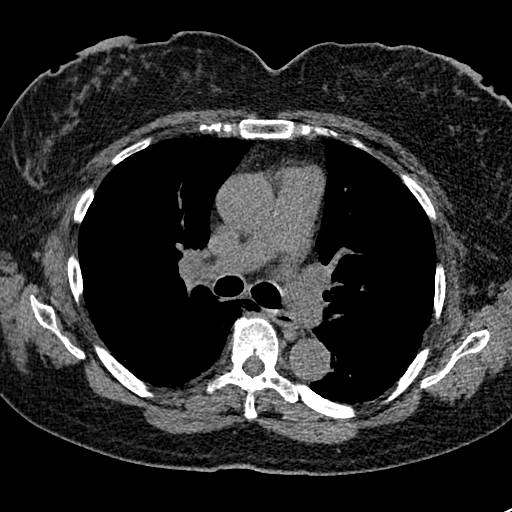
[im 80/144  lung]
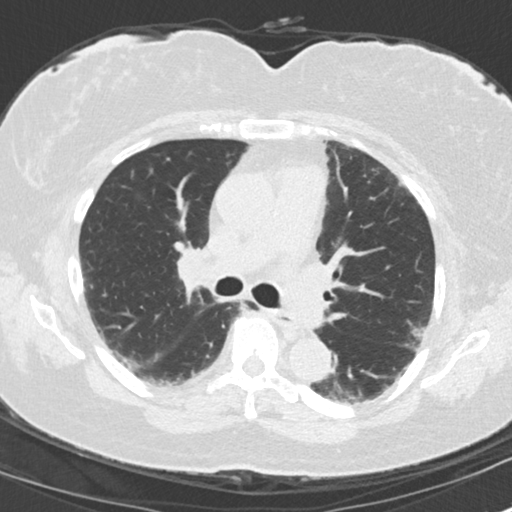
[im 86/144  lung]
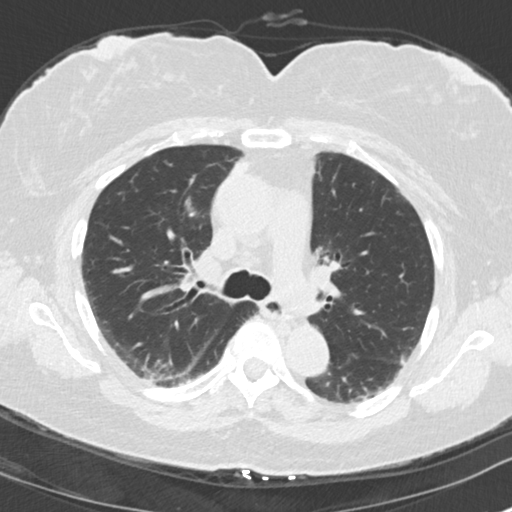
[im 96/144  lung]
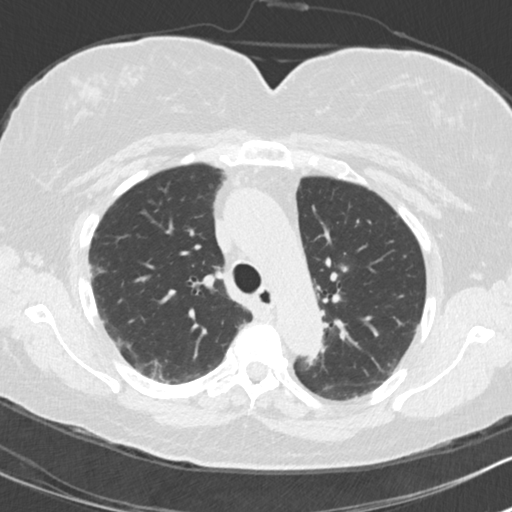
[im 106/144  lung]
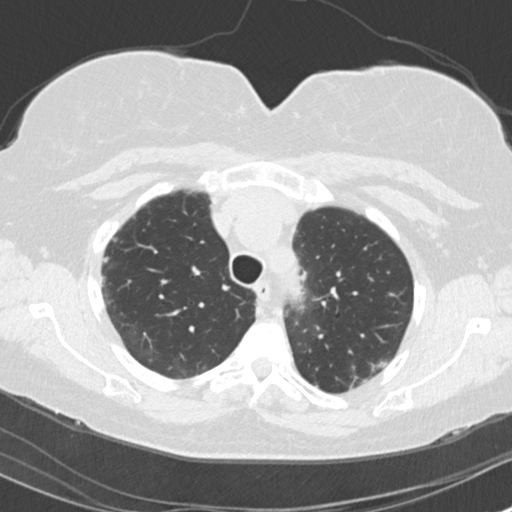
[im 115/144  mediastinal]
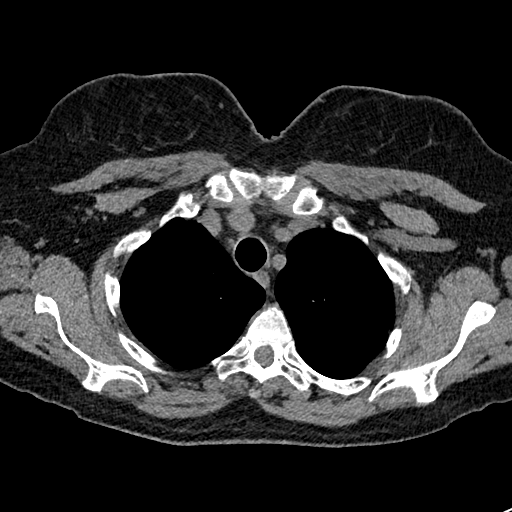
[im 115/144  lung]
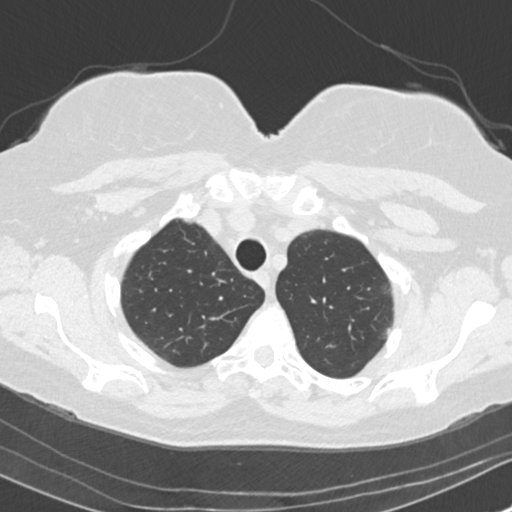
[im 122/144  lung]
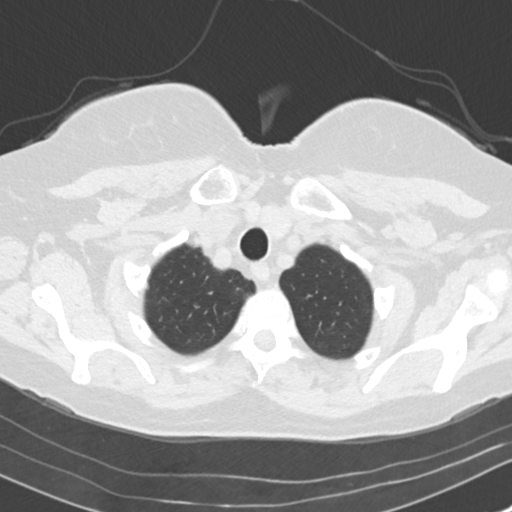
[im 133/144  lung]
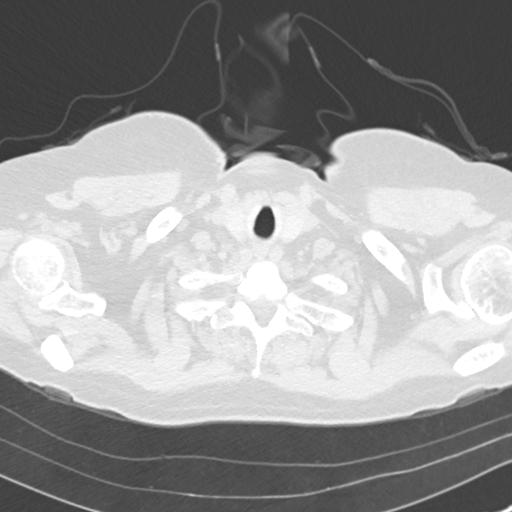

[15 of 34 positions shown; findings below may reference images not displayed]

FINDINGS: Cardiovascular: Limited unenhanced imaging of the heart and great
vessels demonstrates no pericardial effusion. Normal caliber of the
thoracic aorta. Mild atherosclerosis of the aorta and coronary
vessels.

Mediastinum/Nodes: No enlarged mediastinal or axillary lymph nodes.
Thyroid gland, trachea, and esophagus demonstrate no significant
findings.

Lungs/Pleura: There is bibasilar cylindrical bronchiectasis. Basilar
predominant subpleural scarring and fibrosis is noted. Minimal
scattered ground-glass airspace disease within the subpleural
regions of the upper lobes. Pattern is consistent with UIP, and
could also be related to underlying history of scleroderma.

No other acute airspace disease, effusion, or pneumothorax. The
central airways are patent.

Upper Abdomen: Calcified gallstones are identified. No acute upper
abdominal finding.

Musculoskeletal: No acute or destructive bony lesions. Reconstructed
images demonstrate no additional findings.
IMPRESSION: 1. Basilar predominant bronchiectasis and subpleural scarring and
fibrosis. Pattern is compatible with UIP, and could also be related
underlying history of scleroderma.
2. Minimal upper lobe ground-glass opacities are likely
inflammatory.
3. Cholelithiasis.
4.  Aortic Atherosclerosis ([id number]).

## 2022-12-10 ENCOUNTER — Other Ambulatory Visit: Payer: Self-pay | Admitting: Specialist

## 2022-12-10 DIAGNOSIS — J8410 Pulmonary fibrosis, unspecified: Secondary | ICD-10-CM

## 2022-12-10 DIAGNOSIS — R0609 Other forms of dyspnea: Secondary | ICD-10-CM

## 2022-12-15 ENCOUNTER — Ambulatory Visit
Admission: RE | Admit: 2022-12-15 | Discharge: 2022-12-15 | Disposition: A | Discharge status: Home / Self Care | Payer: Medicare HMO | Source: Ambulatory Visit | Attending: Specialist | Admitting: Specialist

## 2022-12-15 DIAGNOSIS — J8410 Pulmonary fibrosis, unspecified: Secondary | ICD-10-CM | POA: Diagnosis present

## 2022-12-15 DIAGNOSIS — R0609 Other forms of dyspnea: Secondary | ICD-10-CM | POA: Diagnosis present

## 2022-12-16 ENCOUNTER — Other Ambulatory Visit: Payer: Self-pay | Admitting: Family Medicine

## 2022-12-16 DIAGNOSIS — Z1231 Encounter for screening mammogram for malignant neoplasm of breast: Secondary | ICD-10-CM

## 2022-12-31 ENCOUNTER — Ambulatory Visit
Admission: RE | Admit: 2022-12-31 | Discharge: 2022-12-31 | Disposition: A | Discharge status: Home / Self Care | Payer: Medicare HMO | Source: Ambulatory Visit | Attending: Family Medicine | Admitting: Family Medicine

## 2022-12-31 DIAGNOSIS — Z1231 Encounter for screening mammogram for malignant neoplasm of breast: Secondary | ICD-10-CM | POA: Diagnosis present

## 2023-01-12 ENCOUNTER — Other Ambulatory Visit: Payer: Self-pay | Admitting: Specialist

## 2023-01-12 DIAGNOSIS — K769 Liver disease, unspecified: Secondary | ICD-10-CM

## 2023-01-20 ENCOUNTER — Ambulatory Visit
Admission: RE | Admit: 2023-01-20 | Discharge: 2023-01-20 | Disposition: A | Discharge status: Home / Self Care | Payer: Medicare HMO | Source: Ambulatory Visit | Attending: Specialist | Admitting: Specialist

## 2023-01-20 DIAGNOSIS — K769 Liver disease, unspecified: Secondary | ICD-10-CM | POA: Diagnosis present

## 2023-01-20 MED ORDER — GADOBUTROL 1 MMOL/ML IV SOLN
10.0000 mL | Freq: Once | INTRAVENOUS | Status: AC | PRN
  Administered 2023-01-20: 10 mL via INTRAVENOUS

## 2023-08-18 ENCOUNTER — Other Ambulatory Visit: Payer: Self-pay | Admitting: Specialist

## 2023-08-18 ENCOUNTER — Other Ambulatory Visit
Admission: RE | Admit: 2023-08-18 | Discharge: 2023-08-18 | Disposition: A | Discharge status: Home / Self Care | Source: Ambulatory Visit | Attending: Specialist | Admitting: Specialist

## 2023-08-18 ENCOUNTER — Ambulatory Visit
Admission: RE | Admit: 2023-08-18 | Discharge: 2023-08-18 | Disposition: A | Discharge status: Home / Self Care | Source: Ambulatory Visit | Attending: Specialist | Admitting: Specialist

## 2023-08-18 DIAGNOSIS — R079 Chest pain, unspecified: Secondary | ICD-10-CM

## 2023-08-18 DIAGNOSIS — R7989 Other specified abnormal findings of blood chemistry: Secondary | ICD-10-CM | POA: Diagnosis present

## 2023-08-18 DIAGNOSIS — R0781 Pleurodynia: Secondary | ICD-10-CM | POA: Insufficient documentation

## 2023-08-18 LAB — POCT I-STAT CREATININE: Creatinine, Ser: 1.1 mg/dL — ABNORMAL HIGH (ref 0.44–1.00)

## 2023-08-18 LAB — D-DIMER, QUANTITATIVE: D-Dimer, Quant: 0.86 ug{FEU}/mL — ABNORMAL HIGH (ref 0.00–0.50)

## 2023-08-18 MED ORDER — IOHEXOL 350 MG/ML SOLN
75.0000 mL | Freq: Once | INTRAVENOUS | Status: AC | PRN
  Administered 2023-08-18: 75 mL via INTRAVENOUS

## 2023-12-18 ENCOUNTER — Other Ambulatory Visit: Payer: Self-pay | Admitting: Family Medicine

## 2023-12-18 DIAGNOSIS — Z1231 Encounter for screening mammogram for malignant neoplasm of breast: Secondary | ICD-10-CM

## 2024-01-25 ENCOUNTER — Ambulatory Visit
Admission: RE | Admit: 2024-01-25 | Discharge: 2024-01-25 | Disposition: A | Source: Ambulatory Visit | Attending: Family Medicine | Admitting: Family Medicine

## 2024-01-25 DIAGNOSIS — Z1231 Encounter for screening mammogram for malignant neoplasm of breast: Secondary | ICD-10-CM

## 2024-03-12 ENCOUNTER — Emergency Department

## 2024-03-12 ENCOUNTER — Encounter: Payer: Self-pay | Admitting: Emergency Medicine

## 2024-03-12 ENCOUNTER — Emergency Department
Admission: EM | Admit: 2024-03-12 | Discharge: 2024-03-12 | Disposition: A | Discharge status: Home / Self Care | Attending: Emergency Medicine | Admitting: Emergency Medicine

## 2024-03-12 ENCOUNTER — Other Ambulatory Visit: Payer: Self-pay

## 2024-03-12 DIAGNOSIS — R519 Headache, unspecified: Secondary | ICD-10-CM | POA: Diagnosis not present

## 2024-03-12 DIAGNOSIS — R030 Elevated blood-pressure reading, without diagnosis of hypertension: Secondary | ICD-10-CM | POA: Insufficient documentation

## 2024-03-12 DIAGNOSIS — I509 Heart failure, unspecified: Secondary | ICD-10-CM | POA: Insufficient documentation

## 2024-03-12 DIAGNOSIS — R600 Localized edema: Secondary | ICD-10-CM

## 2024-03-12 DIAGNOSIS — M7989 Other specified soft tissue disorders: Secondary | ICD-10-CM | POA: Diagnosis present

## 2024-03-12 HISTORY — DX: Pulmonary fibrosis, unspecified: J84.10

## 2024-03-12 LAB — CBC WITH DIFFERENTIAL/PLATELET
Abs Immature Granulocytes: 0.01 10*3/uL (ref 0.00–0.07)
Basophils Absolute: 0.1 10*3/uL (ref 0.0–0.1)
Basophils Relative: 1 %
Eosinophils Absolute: 0.2 10*3/uL (ref 0.0–0.5)
Eosinophils Relative: 4 %
HCT: 35.2 % — ABNORMAL LOW (ref 36.0–46.0)
Hemoglobin: 10.8 g/dL — ABNORMAL LOW (ref 12.0–15.0)
Immature Granulocytes: 0 %
Lymphocytes Relative: 20 %
Lymphs Abs: 1.1 10*3/uL (ref 0.7–4.0)
MCH: 25.5 pg — ABNORMAL LOW (ref 26.0–34.0)
MCHC: 30.7 g/dL (ref 30.0–36.0)
MCV: 83 fL (ref 80.0–100.0)
Monocytes Absolute: 0.9 10*3/uL (ref 0.1–1.0)
Monocytes Relative: 17 %
Neutro Abs: 3.3 10*3/uL (ref 1.7–7.7)
Neutrophils Relative %: 58 %
Platelets: 323 10*3/uL (ref 150–400)
RBC: 4.24 MIL/uL (ref 3.87–5.11)
RDW: 15.8 % — ABNORMAL HIGH (ref 11.5–15.5)
WBC: 5.6 10*3/uL (ref 4.0–10.5)
nRBC: 0 % (ref 0.0–0.2)

## 2024-03-12 LAB — BASIC METABOLIC PANEL WITH GFR
Anion gap: 11 (ref 5–15)
BUN: 11 mg/dL (ref 8–23)
CO2: 24 mmol/L (ref 22–32)
Calcium: 8.9 mg/dL (ref 8.9–10.3)
Chloride: 107 mmol/L (ref 98–111)
Creatinine, Ser: 0.62 mg/dL (ref 0.44–1.00)
GFR, Estimated: >60 mL/min
Glucose, Bld: 100 mg/dL — ABNORMAL HIGH (ref 70–99)
Potassium: 3.5 mmol/L (ref 3.5–5.1)
Sodium: 141 mmol/L (ref 135–145)

## 2024-03-12 LAB — PRO BRAIN NATRIURETIC PEPTIDE: Pro Brain Natriuretic Peptide: 1295 pg/mL — ABNORMAL HIGH

## 2024-03-12 MED ORDER — FUROSEMIDE 10 MG/ML IJ SOLN
20.0000 mg | Freq: Once | INTRAMUSCULAR | Status: AC
  Administered 2024-03-12: 20 mg via INTRAVENOUS
  Filled 2024-03-12: qty 4

## 2024-03-12 MED ORDER — FUROSEMIDE 20 MG PO TABS: 20.0000 mg | ORAL_TABLET | Freq: Every day | ORAL | 0 refills | Status: DC

## 2024-03-12 NOTE — ED Triage Notes (Signed)
 Patient brought in by husband from home. Headache xweeks, but tonight at ~2230 patient felt sudden severe pain, states it felt like something exploded in my head. Severe pain immediately resolved. Patient took BP at home 171/109, called fire department and BP 190/120. No hx of HTN. AxOx4 and acting appropriately in triage, current headache 5/10, pt denies all other symptoms.

## 2024-03-12 NOTE — ED Provider Notes (Signed)
 "  [facility] Provider Note    Event Date/Time   First MD Initiated Contact with Patient 03/12/24 [id number]     (approximate)   History   Hypertension and Headache   HPI Jocelyn MITTELMAN is a 74 y.o. female whose medical history is most notable for pulmonary fibrosis followed by Dr. Theotis.  She has no known history of CHF.  She presents for evaluation of about a month of gradually worsening swelling in her legs which seems to correlate with a worsening of her cough.  She saw Dr. Theotis as recently as about 9 days ago and they are changing around her medication regimen assuming that her issue is with the pulmonary fibrosis.  However the cough is persistent and exacerbated by environmental triggers such as strong smells.  She also coughs worse if she tries to lie down flat but that has been going on since she originally got her pulmonary fibrosis diagnosis.  She is not short of breath unless she is coughing hard.  She has had no chest pain.  She has had no abdominal pain.  Besides the swelling in her legs which goes up to her thighs and both sides, she has been having intermittent headaches associated with her coughing.  Tonight she had a cough and then had a brief flash of pain that was generalized in her head and severe, but it went away immediately.  However she wanted to get checked out to make sure she was okay.  She has had no additional headache for hours and has no numbness or weakness in her extremities.     Physical Exam   Triage Vital Signs: ED Triage Vitals  Encounter Vitals Group     BP 03/12/24 0034 (!) 184/109     Girls Systolic BP Percentile --      Girls Diastolic BP Percentile --      Boys Systolic BP Percentile --      Boys Diastolic BP Percentile --      Pulse Rate 03/12/24 0034 90     Resp 03/12/24 0034 (!) 22     Temp 03/12/24 0034 98.4 F (36.9 C)     Temp Source 03/12/24 0034 Oral     SpO2 03/12/24 0034 93 %     Weight 03/12/24 0042 93 kg  (205 lb)     Height 03/12/24 0042 1.626 m (5' 4)     Head Circumference --      Peak Flow --      Pain Score 03/12/24 0041 5     Pain Loc --      Pain Education --      Exclude from Growth Chart --     Most recent vital signs: Vitals:   03/12/24 0300 03/12/24 0400  BP: (!) 161/98 (!) 156/99  Pulse: (!) 102 (!) 101  Resp:    Temp:    SpO2: 100% 99%    General: Awake, no distress.  Pleasant and conversant, generally well-appearing and appears younger than chronological age.  Pupils are equal and reactive and extraocular motion is normal. CV:  Good peripheral perfusion.  Borderline tachycardia, regular rhythm, normal heart sounds. Resp:  Normal effort. Speaking easily and comfortably, no accessory muscle usage nor intercostal retractions.  Lungs are clear to auscultation but she has a dry cough that occurs as soon as she takes deep inspirations. Abd:  No distention.  No tenderness to palpation of the abdomen. Other:  2+ pitting edema all throughout bilateral  lower extremities up to just beyond her knees.  No evidence of infection.  No upper extremity edema and no evidence of generalized anasarca.   ED Results / Procedures / Treatments   Labs (all labs ordered are listed, but only abnormal results are displayed) Labs Reviewed  CBC WITH DIFFERENTIAL/PLATELET - Abnormal; Notable for the following components:      Result Value   Hemoglobin 10.8 (*)    HCT 35.2 (*)    MCH 25.5 (*)    RDW 15.8 (*)    All other components within normal limits  BASIC METABOLIC PANEL WITH GFR - Abnormal; Notable for the following components:   Glucose, Bld 100 (*)    All other components within normal limits  PRO BRAIN NATRIURETIC PEPTIDE - Abnormal; Notable for the following components:   Pro Brain Natriuretic Peptide 1,295.0 (*)    All other components within normal limits     EKG  ED ECG REPORT I, Darleene Dome, the attending physician, personally viewed and interpreted this ECG.  Date:  03/12/2024 EKG Time: 4:05 AM Rate: 82 Rhythm: normal sinus rhythm QRS Axis: normal Intervals: Slightly prolonged QTc of 505 ms ST/T Wave abnormalities: normal Narrative Interpretation: no evidence of acute ischemia    RADIOLOGY I independently viewed and interpret the patient's CT head and I see no evidence of intracranial hemorrhage or neoplasm.  I also read the radiologist's report, which confirmed no acute findings.  I also independently viewed interpret the patient's chest x-ray and there appears to be bibasilar opacities but is unclear if this is representative of her chronic pulmonary fibrosis.  See ED course for additional commentary regarding radiology report   PROCEDURES:  Critical Care performed: No  Procedures    IMPRESSION / MDM / ASSESSMENT AND PLAN / ED COURSE  I reviewed the triage vital signs and the nursing notes.                              Differential diagnosis includes, but is not limited to, new onset CHF, worsening pulmonary fibrosis, less likely PE or DVT, intracranial hemorrhage or aneurysm.  Patient's presentation is most consistent with acute presentation with potential threat to life or bodily function.  Labs/studies ordered (see ED course for additional labs and studies that may have been added later): CT head, two-view chest x-ray, EKG, CBC with differential, proBNP, BMP  Interventions/Medications given:  Medications  furosemide  (LASIX ) injection 20 mg (20 mg Intravenous Given 03/12/24 0355)    (Note:  hospital course my include additional interventions and/or labs/studies not listed above.)   Patient is well-appearing and in no distress, pleasant and conversant, just coughs frequently with deep inspiration.  Her lab workup and physical exam are suggestive of new onset CHF.  She has not had an echocardiogram for years and is unaware of having a reduced ejection fraction but her proBNP is about 1300 and she has peripheral edema.  However this  has been a long and slow process over about a month and she is not showing signs or symptoms of acute pulmonary edema.  I considered admitting her for echocardiogram and cardiology consults and additional management of volume overload in the setting of probable congestive heart failure, but she would prefer to follow-up as an outpatient.  Given that the symptoms are subacute versus chronic rather than acute, she may be appropriate for outpatient management.  Her electrolytes are appropriate as is her renal function.  I  will await the official radiology report on her chest x-ray but I also personally viewed and interpreted her CTA chest from last year and there was significant pulmonary parenchymal changes in bilateral lung bases at that time from the pulmonary fibrosis and I suspect that is what I am seeing on the chest x-ray.  I anticipate discharge with close follow-up with Ellouise Class at the CHF clinic and a week long course of furosemide  to begin treating her volume overload.  She is comfortable with this plan as is her husband who is at bedside.  Clinical Course as of 03/12/24 0416  Sat Mar 12, 2024  0346 DG Chest 2 View Radiologist confirms that the changes appear chronic.  I think the patient would be a good candidate to start on furosemide  and follow-up with CHF clinic.  I am making the referral to [facility] and will advise close follow-up and I will start her on a relatively low dose of furosemide .  I will update the patient and her husband about the plan. [CF]    Clinical Course User Index [CF] Gordan Huxley, MD     FINAL CLINICAL IMPRESSION(S) / ED DIAGNOSES   Final diagnoses:  New onset of congestive heart failure (HCC)  Peripheral edema  Elevated blood pressure reading     Rx / DC Orders   ED Discharge Orders          Ordered    AMB referral to CHF clinic        03/12/24 0412    furosemide  (LASIX ) 20 MG tablet  Daily        03/12/24 0413             Note:   This document was prepared using Dragon voice recognition software and may include unintentional dictation errors.   Gordan Huxley, MD 03/12/24 [phone]  "

## 2024-03-12 NOTE — Discharge Instructions (Signed)
 As we discussed, your evaluation tonight would suggest that you may have developed what is known as congestive heart failure, or CHF.  This does not mean that your heart has failed, just that it is not working as efficiently as it has in the past, and that may be leading to the fluid in your legs and possibly some of the increased cough that you have been experiencing.  Try taking the new medication prescribed tonight (furosemide , or Lasix ) which should help you urinate off some of the extra fluid.  We gave you only a week of medication to start with and you should follow-up either with your regular doctors or preferably at the [facility] CHF clinic to see the specialist and get started on additional outpatient workup.  If you develop any new or worsening symptoms that concern you, please return immediately to the emergency department

## 2024-03-17 ENCOUNTER — Telehealth: Payer: Self-pay | Admitting: Family

## 2024-03-17 NOTE — Progress Notes (Signed)
 "  [facility] Note   Referring Physician: EDP Smiley) 01/26 PCP: Valora Lynwood FALCON, MD Cardiologist: None   Chief Complaint: fatigue   HPI:  Jocelyn Ward is a 74 y.o. female with a history of HTN, pulmonary fibrosis, HFpEF, mixed connective tissue disease (scleroderma, +ANA, Raynaud's), HLD, anemia, osteoarthritis, RA.  Echo 09/15/23: EF >55%, normal RV, normal valves  Was in the ED 03/12/24 with a month of gradually worsening swelling in her legs which seems to correlate with a worsening of her cough along with HA. CT head without evidence of intracranial hemorrhage or neoplasm. CXR with bibasilar opacities. Labs and CPE consistent with HF. IV diuresed with transition to oral diuretics.  She presents today, with her husband, for an initial HF visit with a chief complaint of fatigue. Has associated shortness of breath, cough, palpitations, pedal edema. Sleeping well with occasional wheeze. Pedal edema is improving. Denies chest pain, dizziness, snoring, apnea. Has a history of mixed connective tissue disease and is followed closely by rheumatology. Anticipates difficulty in getting compression socks on due to RA in her hands   Not adding salt. Weighing daily. 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 They are looking to hire caregivers to allow the patient to take care of herself. Denies tobacco, alcohol, drug use.   She says that her PCP sent in a RX for amlodipine but she hasn't picked it up yet.   Review of Systems: [y] = yes, [ ]  = no   General: Weight gain [ ] ; Weight loss [ ] ; Anorexia [ ] ; Fatigue [email or web address] ]; Fever [ ] ; Chills [ ] ; Weakness [ ]   Cardiac: Chest pain/pressure [ ] ; Resting SOB [ ] ; Exertional SOB [email or web address] ]; Orthopnea [ ] ; Pedal Edema [email or web address] ]; Palpitations [email or web address] ]; Syncope [ ] ; Presyncope [ ] ; Paroxysmal nocturnal dyspnea[ ]   Pulmonary: Cough [ y]; [facility] ]; Hemoptysis[ ] ; Sputum [ ] ; Snoring [ ]   GI: Vomiting[ ] ; Dysphagia[ ] ; Melena[ ] ; Hematochezia [ ] ;  Heartburn[ ] ; Abdominal pain [ ] ; Constipation [ ] ; Diarrhea [ ] ; BRBPR [ ]   GU: Hematuria[ ] ; Dysuria [ ] ; Nocturia[ ]   Vascular: Pain in legs with walking [ ] ; Pain in feet with lying flat [ ] ; Non-healing sores [ ] ; Stroke [ ] ; TIA [ ] ; Slurred speech [ ] ;  Neuro: Headaches[ ] ; Vertigo[ ] ; Seizures[ ] ; Paresthesias[ ] ;Blurred vision [ ] ; Diplopia [ ] ; Vision changes [ ]   Ortho/Skin: Arthritis [ ] ; Joint pain [ ] ; Muscle pain [ ] ; Joint swelling [ ] ; Back Pain [ ] ; Rash [ ]   Psych: Depression[ ] ; Anxiety[ ]   Heme: Bleeding problems [ ] ; Clotting disorders [ ] ; Anemia davis.dad ]  Endocrine: Diabetes [ ] ; Thyroid dysfunction[ ]    Past Medical History:  Diagnosis Date   Pulmonary fibrosis (HCC)     Current Outpatient Medications  Medication Sig Dispense Refill   COVID-19 mRNA vaccine, Moderna, (MODERNA COVID-19 VACCINE ) 100 MCG/0.5ML injection Inject into the muscle. 0.25 mL 0   fluticasone (FLONASE) 50 MCG/ACT nasal spray PLACE 2 SPRAYS INTO BOTH NOSTRILS ONCE DAILY.  0   furosemide  (LASIX ) 20 MG tablet Take 1 tablet (20 mg total) by mouth daily. 7 tablet 0   No current facility-administered medications for this visit.    Allergies[1]    Social History   Socioeconomic History   Marital status: Married    Spouse name: Not on file   Number of children: Not on file   Years of  education: Not on file   Highest education level: Not on file  Occupational History   Not on file  Tobacco Use   Smoking status: Never   Smokeless tobacco: Never  Substance and Sexual Activity   Alcohol use: No    Alcohol/week: 0.0 standard drinks of alcohol   Drug use: No   Sexual activity: Not on file  Other Topics Concern   Not on file  Social History Narrative   Not on file   Social Drivers of Health   Tobacco Use: Low Risk  (03/03/2024)   Received from [facility]   Patient History    Smoking Tobacco Use: Never    Smokeless Tobacco Use: Never    Passive Exposure: Not on file   Financial Resource Strain: Low Risk  (10/08/2023)   Received from [facility]   Overall Financial Resource Strain (CARDIA)    Difficulty of Paying Living Expenses: Not hard at all  Food Insecurity: No Food Insecurity (10/08/2023)   Received from [facility]   Epic    Within the past 12 months, you worried that your food would run out before you got the money to buy more.: Never true    Within the past 12 months, the food you bought just didn't last and you didn't have money to get more.: Never true  Transportation Needs: No Transportation Needs (10/08/2023)   Received from [facility] - Transportation    In the past 12 months, has lack of transportation kept you from medical appointments or from getting medications?: No    Lack of Transportation (Non-Medical): No  Physical Activity: Not on file  Stress: Not on file  Social Connections: Not on file  Intimate Partner Violence: Not on file  Depression (EYV7-0): Not on file  Alcohol Screen: Not on file  Housing: Low Risk  (10/08/2023)   Received from [facility]   Epic    In the last 12 months, was there a time when you were not able to pay the mortgage or rent on time?: No    In the past 12 months, how many times have you moved where you were living?: 0    At any time in the past 12 months, were you homeless or living in a shelter (including now)?: No  Utilities: Not At Risk (10/08/2023)   Received from [facility]   Epic    In the past 12 months has the electric, gas, oil, or water company threatened to shut off services in your home?: No  Health Literacy: Not on file      Family History  Problem Relation Age of Onset   Breast cancer Neg Hx    Vitals:   03/18/24 0915  BP: 136/80  Pulse: 93  SpO2: 97%  Weight: 203 lb 12.8 oz (92.4 kg)   Wt Readings from Last 3 Encounters:  03/18/24 203 lb 12.8 oz (92.4 kg)  03/12/24 205 lb (93  kg)  10/10/14 202 lb (91.6 kg)   Lab Results  Component Value Date   CREATININE 0.72 03/18/2024   CREATININE 0.62 03/12/2024   CREATININE 1.10 (H) 08/18/2023   PHYSICAL EXAM: General: Well appearing.  Cor: No JVD. Regular rhythm, rate.  Lungs: clear Abdomen: soft, nontender, nondistended. Extremities: 2+ pitting edema bilateral lower legs. Bilateral hands disfigured due to RA Neuro:. Affect pleasant   ECG 03/12/24: NSR   ASSESSMENT & PLAN:  1: Chronic  HFpEF- - unclear etiology although could be due to HTN or her connective tissue disease. Last Echo was 07/25 with normal EF. With her strong history of mixed connective tissue disease (scleroderma, + ANA, Raynaud's) will get cMRI to check for amyloid / cardiac sarcoid.  - NYHA class II - euvolemic - weighing daily. Reviewed parameters to call about - Echo 09/15/23: EF >55%, normal RV, normal valves - Continue furosemide  20mg  daily. - Begin spironolactone  25mg  daily. BMET today and repeat next week - Could consider adding SGLT2 in the future.  - Advised her to not start amlodipine as that could make her edema worse. - not adding salt to her food. Says that she drinks 1 bottle of water daily and not much else. Reviewed drinking 50-64 ounces of fluid daily and rational for doing this explained.  - proBNP 03/12/24 was 1295.0. Check this today and again, next week.   2: HTN- - BP 136/80. Starting spironolactone  per above. Do not start amlodipine at this time.  - saw PCP 08/25 - BMET 03/12/24 reviewed: K 3.5, creatinine 0.62, GFR >60  3: Pulmonary fibrosis- - saw pulmonology Alica) 01/26 - PFT's done 01/26 - Will be starting Jascayd once insurance approves  4:Mixed connective tissue disease- (scleroderma, +ANA, Raynaud's) - saw rheumatology 11/25 - Currently taking arava.   5: HLD- - LDL 09/15/23 was 158 - Will repeat lipids next week  6: Lymphedema- - Stage 2. Does improve some overnight with legs in the bed - Encouraged  to wear compression socks daily with removal at bedtime - Prescription for compression socks provided today   Return here 3-4 weeks, sooner if needed.   I spent 45 minutes reviewing records, interviewing/ examing patient and managing plan/ orders.   Ellouise DELENA Class, FNP 03/17/24     [1] No Known Allergies  "

## 2024-03-17 NOTE — Telephone Encounter (Signed)
 Called to confirm/remind patient of their appointment at the [facility] on 03/18/24.   Appointment:   [x] Confirmed  [] Left mess   [] No answer/No voice mail  [] VM Full/unable to leave message  [] Phone not in service  Patient reminded to bring all medications and/or complete list.  Confirmed patient has transportation. Gave directions, instructed to utilize valet parking.

## 2024-03-18 ENCOUNTER — Encounter: Payer: Self-pay | Admitting: Family

## 2024-03-18 ENCOUNTER — Ambulatory Visit: Admitting: Family

## 2024-03-18 ENCOUNTER — Other Ambulatory Visit
Admission: RE | Admit: 2024-03-18 | Discharge: 2024-03-18 | Disposition: A | Source: Ambulatory Visit | Attending: Family | Admitting: Family

## 2024-03-18 ENCOUNTER — Ambulatory Visit: Payer: Self-pay | Admitting: Family

## 2024-03-18 ENCOUNTER — Other Ambulatory Visit: Payer: Self-pay | Admitting: Family

## 2024-03-18 VITALS — BP 136/80 | HR 93 | Wt 203.8 lb

## 2024-03-18 DIAGNOSIS — I10 Essential (primary) hypertension: Secondary | ICD-10-CM | POA: Diagnosis not present

## 2024-03-18 DIAGNOSIS — M359 Systemic involvement of connective tissue, unspecified: Secondary | ICD-10-CM | POA: Diagnosis not present

## 2024-03-18 DIAGNOSIS — E782 Mixed hyperlipidemia: Secondary | ICD-10-CM | POA: Diagnosis not present

## 2024-03-18 DIAGNOSIS — I5032 Chronic diastolic (congestive) heart failure: Secondary | ICD-10-CM | POA: Insufficient documentation

## 2024-03-18 DIAGNOSIS — J8410 Pulmonary fibrosis, unspecified: Secondary | ICD-10-CM

## 2024-03-18 DIAGNOSIS — I890 Lymphedema, not elsewhere classified: Secondary | ICD-10-CM

## 2024-03-18 LAB — BASIC METABOLIC PANEL WITH GFR
Anion gap: 8 (ref 5–15)
BUN: 10 mg/dL (ref 8–23)
CO2: 28 mmol/L (ref 22–32)
Calcium: 8.7 mg/dL — ABNORMAL LOW (ref 8.9–10.3)
Chloride: 105 mmol/L (ref 98–111)
Creatinine, Ser: 0.72 mg/dL (ref 0.44–1.00)
GFR, Estimated: >60 mL/min
Glucose, Bld: 88 mg/dL (ref 70–99)
Potassium: 3.4 mmol/L — ABNORMAL LOW (ref 3.5–5.1)
Sodium: 142 mmol/L (ref 135–145)

## 2024-03-18 LAB — PRO BRAIN NATRIURETIC PEPTIDE: Pro Brain Natriuretic Peptide: 924 pg/mL — ABNORMAL HIGH

## 2024-03-18 MED ORDER — SPIRONOLACTONE 25 MG PO TABS: 25.0000 mg | ORAL_TABLET | Freq: Every day | ORAL | 1 refills | Status: DC

## 2024-03-18 MED ORDER — FUROSEMIDE 20 MG PO TABS: 20.0000 mg | ORAL_TABLET | Freq: Every day | ORAL | 1 refills | Status: AC

## 2024-03-18 MED ORDER — SPIRONOLACTONE 25 MG PO TABS: 25.0000 mg | ORAL_TABLET | Freq: Every day | ORAL | 1 refills | Status: AC

## 2024-03-18 NOTE — Patient Instructions (Addendum)
 Continue weighing daily and call for an overnight weight gain of 3 pounds or more or a weekly weight gain of more than 5 pounds.   Drink between 50-64 ounces of fluid daily.   Wear compression socks daily with removal at bedtime.   Medication Changes:  STOP Amlodipine  START Spironolactone  25mg  daily   Lab Work:  Go over to the [facility]. Go pass the gift shop and have your blood work completed TODAY AND IN ONE WEEK.   We will only call you if the results are abnormal or if the provider would like to make medication changes.  No news is good news.   Testing:  Your physician has requested that you have a cardiac MRI. Cardiac MRI uses a computer to create images of your heart as its beating, producing both still and moving pictures of your heart and major blood vessels. For further information please visit [email or web address]. Please follow the instruction sheet given to you today for more information.  Someone will contact you in order to schedule your appointment. You will need to have blood work done 2 weeks prior to your MRI test.  Follow-Up in: Please follow up with the [facility] Clinic in 1 month with Ellouise Class, FNP.   Thank you for choosing [facility].    At the [facility] Clinic, you and your health needs are our priority. We have a designated team specialized in the treatment of Heart Failure. This Care Team includes your primary Heart Failure Specialized Cardiologist (physician), Advanced Practice Providers (APPs- Physician Assistants and Nurse Practitioners), and Pharmacist who all work together to provide you with the care you need, when you need it.   You may see any of the following providers on your designated Care Team at your next follow up:  Dr. Toribio Fuel Dr. Ezra Shuck Dr. Ria Commander Dr. Morene Brownie Ellouise Class, FNP Jaun Bash, RPH-CPP  Please be sure to bring in all  your medications bottles to every appointment.   Need to Contact Us :  If you have any questions or concerns before your next appointment please send us  a message through [place] or call our office at [phone].    TO LEAVE A MESSAGE FOR THE NURSE SELECT OPTION 2, PLEASE LEAVE A MESSAGE INCLUDING: YOUR NAME DATE OF BIRTH CALL BACK NUMBER REASON FOR CALL**this is important as we prioritize the call backs  YOU WILL RECEIVE A CALL BACK THE SAME DAY AS LONG AS YOU CALL BEFORE 4:00 PM

## 2024-03-25 ENCOUNTER — Other Ambulatory Visit: Admission: RE | Admit: 2024-03-25 | Source: Home / Self Care

## 2024-03-25 LAB — BASIC METABOLIC PANEL WITH GFR
Anion gap: 7 (ref 5–15)
BUN: 9 mg/dL (ref 8–23)
CO2: 28 mmol/L (ref 22–32)
Calcium: 8.6 mg/dL — ABNORMAL LOW (ref 8.9–10.3)
Chloride: 105 mmol/L (ref 98–111)
Creatinine, Ser: 0.65 mg/dL (ref 0.44–1.00)
GFR, Estimated: >60 mL/min
Glucose, Bld: 91 mg/dL (ref 70–99)
Potassium: 3.4 mmol/L — ABNORMAL LOW (ref 3.5–5.1)
Sodium: 140 mmol/L (ref 135–145)

## 2024-03-25 LAB — CBC
HCT: 33.6 % — ABNORMAL LOW (ref 36.0–46.0)
Hemoglobin: 10.4 g/dL — ABNORMAL LOW (ref 12.0–15.0)
MCH: 25.8 pg — ABNORMAL LOW (ref 26.0–34.0)
MCHC: 31 g/dL (ref 30.0–36.0)
MCV: 83.4 fL (ref 80.0–100.0)
Platelets: 308 10*3/uL (ref 150–400)
RBC: 4.03 MIL/uL (ref 3.87–5.11)
RDW: 15.3 % (ref 11.5–15.5)
WBC: 6 10*3/uL (ref 4.0–10.5)
nRBC: 0 % (ref 0.0–0.2)

## 2024-03-25 LAB — LIPID PANEL
Cholesterol: 210 mg/dL — ABNORMAL HIGH (ref 0–200)
HDL: 52 mg/dL
LDL Cholesterol: 140 mg/dL — ABNORMAL HIGH (ref 0–99)
Total CHOL/HDL Ratio: 4.1 ratio
Triglycerides: 91 mg/dL
VLDL: 18 mg/dL (ref 0–40)

## 2024-03-25 LAB — PRO BRAIN NATRIURETIC PEPTIDE: Pro Brain Natriuretic Peptide: 1105 pg/mL — ABNORMAL HIGH

## 2024-04-06 ENCOUNTER — Ambulatory Visit

## 2024-04-14 ENCOUNTER — Ambulatory Visit: Admitting: Family
